# Patient Record
Sex: Female | Born: 1960
Health system: Southern US, Community
[De-identification: ages and names within clinical notes are randomized; demographics above are authoritative.]

## PROBLEM LIST (undated history)

## (undated) DIAGNOSIS — F419 Anxiety disorder, unspecified: Secondary | ICD-10-CM

## (undated) DIAGNOSIS — E785 Hyperlipidemia, unspecified: Secondary | ICD-10-CM

## (undated) DIAGNOSIS — Z8619 Personal history of other infectious and parasitic diseases: Secondary | ICD-10-CM

## (undated) DIAGNOSIS — I10 Essential (primary) hypertension: Secondary | ICD-10-CM

## (undated) HISTORY — DX: Personal history of other infectious and parasitic diseases: Z86.19

## (undated) HISTORY — DX: Essential (primary) hypertension: I10

## (undated) HISTORY — DX: Hyperlipidemia, unspecified: E78.5

## (undated) HISTORY — DX: Anxiety disorder, unspecified: F41.9

## (undated) HISTORY — PX: OTHER SURGICAL HISTORY: SHX169

---

## 2000-09-02 ENCOUNTER — Emergency Department (HOSPITAL_COMMUNITY): Admission: EM | Admit: 2000-09-02 | Discharge: 2000-09-02 | Payer: Self-pay | Admitting: *Deleted

## 2000-09-02 ENCOUNTER — Encounter: Payer: Self-pay | Admitting: Emergency Medicine

## 2001-02-16 ENCOUNTER — Encounter: Payer: Self-pay | Admitting: *Deleted

## 2001-02-16 ENCOUNTER — Ambulatory Visit (HOSPITAL_COMMUNITY): Admission: RE | Admit: 2001-02-16 | Discharge: 2001-02-16 | Payer: Self-pay | Admitting: *Deleted

## 2005-09-26 ENCOUNTER — Ambulatory Visit (HOSPITAL_COMMUNITY): Admission: RE | Admit: 2005-09-26 | Discharge: 2005-09-26 | Payer: Self-pay | Admitting: Obstetrics and Gynecology

## 2005-10-13 ENCOUNTER — Encounter: Admission: RE | Admit: 2005-10-13 | Discharge: 2005-10-13 | Payer: Self-pay | Admitting: Family Medicine

## 2006-09-30 ENCOUNTER — Ambulatory Visit (HOSPITAL_COMMUNITY): Admission: RE | Admit: 2006-09-30 | Discharge: 2006-09-30 | Payer: Self-pay | Admitting: Obstetrics and Gynecology

## 2007-10-01 ENCOUNTER — Ambulatory Visit (HOSPITAL_COMMUNITY): Admission: RE | Admit: 2007-10-01 | Discharge: 2007-10-01 | Payer: Self-pay | Admitting: Obstetrics and Gynecology

## 2008-10-03 ENCOUNTER — Ambulatory Visit (HOSPITAL_COMMUNITY): Admission: RE | Admit: 2008-10-03 | Discharge: 2008-10-03 | Payer: Self-pay | Admitting: Obstetrics and Gynecology

## 2010-05-05 HISTORY — PX: COLONOSCOPY: SHX174

## 2010-07-08 ENCOUNTER — Other Ambulatory Visit (HOSPITAL_COMMUNITY): Payer: Self-pay | Admitting: Internal Medicine

## 2010-07-08 DIAGNOSIS — Z1231 Encounter for screening mammogram for malignant neoplasm of breast: Secondary | ICD-10-CM

## 2010-07-12 ENCOUNTER — Ambulatory Visit (HOSPITAL_COMMUNITY)
Admission: RE | Admit: 2010-07-12 | Discharge: 2010-07-12 | Disposition: A | Discharge status: Home / Self Care | Payer: BC Managed Care – PPO | Source: Ambulatory Visit | Attending: Internal Medicine | Admitting: Internal Medicine

## 2010-07-12 DIAGNOSIS — Z1231 Encounter for screening mammogram for malignant neoplasm of breast: Secondary | ICD-10-CM | POA: Insufficient documentation

## 2010-07-16 ENCOUNTER — Other Ambulatory Visit: Payer: Self-pay | Admitting: Internal Medicine

## 2010-07-16 DIAGNOSIS — R928 Other abnormal and inconclusive findings on diagnostic imaging of breast: Secondary | ICD-10-CM

## 2010-07-19 ENCOUNTER — Ambulatory Visit
Admission: RE | Admit: 2010-07-19 | Discharge: 2010-07-19 | Disposition: A | Discharge status: Home / Self Care | Payer: BC Managed Care – PPO | Source: Ambulatory Visit | Attending: Internal Medicine | Admitting: Internal Medicine

## 2010-07-19 DIAGNOSIS — R928 Other abnormal and inconclusive findings on diagnostic imaging of breast: Secondary | ICD-10-CM

## 2011-02-19 ENCOUNTER — Encounter: Payer: Self-pay | Admitting: Gastroenterology

## 2011-07-09 ENCOUNTER — Other Ambulatory Visit (HOSPITAL_COMMUNITY): Payer: Self-pay | Admitting: Obstetrics and Gynecology

## 2011-07-09 DIAGNOSIS — Z1231 Encounter for screening mammogram for malignant neoplasm of breast: Secondary | ICD-10-CM

## 2011-10-31 ENCOUNTER — Ambulatory Visit (HOSPITAL_COMMUNITY)
Admission: RE | Admit: 2011-10-31 | Discharge: 2011-10-31 | Disposition: A | Discharge status: Home / Self Care | Payer: BC Managed Care – PPO | Source: Ambulatory Visit | Attending: Obstetrics and Gynecology | Admitting: Obstetrics and Gynecology

## 2011-10-31 DIAGNOSIS — Z1231 Encounter for screening mammogram for malignant neoplasm of breast: Secondary | ICD-10-CM | POA: Insufficient documentation

## 2012-06-14 ENCOUNTER — Ambulatory Visit
Admission: RE | Admit: 2012-06-14 | Discharge: 2012-06-14 | Disposition: A | Discharge status: Home / Self Care | Payer: 59 | Source: Ambulatory Visit | Attending: Internal Medicine | Admitting: Internal Medicine

## 2012-06-14 ENCOUNTER — Other Ambulatory Visit: Payer: Self-pay | Admitting: Internal Medicine

## 2012-06-14 DIAGNOSIS — M25552 Pain in left hip: Secondary | ICD-10-CM

## 2012-09-24 ENCOUNTER — Other Ambulatory Visit (HOSPITAL_COMMUNITY): Payer: Self-pay | Admitting: Obstetrics and Gynecology

## 2012-09-24 DIAGNOSIS — Z1231 Encounter for screening mammogram for malignant neoplasm of breast: Secondary | ICD-10-CM

## 2012-11-01 ENCOUNTER — Ambulatory Visit (HOSPITAL_COMMUNITY)
Admission: RE | Admit: 2012-11-01 | Discharge: 2012-11-01 | Disposition: A | Discharge status: Home / Self Care | Payer: 59 | Source: Ambulatory Visit | Attending: Obstetrics and Gynecology | Admitting: Obstetrics and Gynecology

## 2012-11-01 DIAGNOSIS — Z1231 Encounter for screening mammogram for malignant neoplasm of breast: Secondary | ICD-10-CM | POA: Insufficient documentation

## 2013-02-15 ENCOUNTER — Ambulatory Visit
Admission: RE | Admit: 2013-02-15 | Discharge: 2013-02-15 | Disposition: A | Discharge status: Home / Self Care | Payer: 59 | Source: Ambulatory Visit | Attending: Internal Medicine | Admitting: Internal Medicine

## 2013-02-15 ENCOUNTER — Other Ambulatory Visit: Payer: Self-pay | Admitting: Internal Medicine

## 2013-02-15 DIAGNOSIS — R0602 Shortness of breath: Secondary | ICD-10-CM

## 2013-11-17 ENCOUNTER — Other Ambulatory Visit (HOSPITAL_COMMUNITY): Payer: Self-pay | Admitting: Obstetrics and Gynecology

## 2013-11-17 DIAGNOSIS — Z1231 Encounter for screening mammogram for malignant neoplasm of breast: Secondary | ICD-10-CM

## 2014-01-23 ENCOUNTER — Ambulatory Visit (HOSPITAL_COMMUNITY): Admission: RE | Admit: 2014-01-23 | Payer: 59 | Source: Ambulatory Visit

## 2014-03-06 ENCOUNTER — Ambulatory Visit (HOSPITAL_COMMUNITY)
Admission: RE | Admit: 2014-03-06 | Discharge: 2014-03-06 | Disposition: A | Discharge status: Home / Self Care | Payer: 59 | Source: Ambulatory Visit | Attending: Obstetrics and Gynecology | Admitting: Obstetrics and Gynecology

## 2014-03-06 DIAGNOSIS — Z1231 Encounter for screening mammogram for malignant neoplasm of breast: Secondary | ICD-10-CM

## 2017-05-07 ENCOUNTER — Telehealth: Payer: Self-pay | Admitting: Podiatry

## 2017-05-07 ENCOUNTER — Encounter: Payer: Self-pay | Admitting: Podiatry

## 2017-05-07 ENCOUNTER — Ambulatory Visit (INDEPENDENT_AMBULATORY_CARE_PROVIDER_SITE_OTHER): Payer: Self-pay | Admitting: Podiatry

## 2017-05-07 DIAGNOSIS — M779 Enthesopathy, unspecified: Secondary | ICD-10-CM

## 2017-05-07 NOTE — Telephone Encounter (Signed)
I was seen today by Dr. Paulla Dolly. I was just calling to make sure he sends my notes to my PCP Dr. Joylene Draft at [facility]. You don't have to call me. I just didn't know if it was automatic or if I had to request it. Thank you.

## 2017-05-07 NOTE — Progress Notes (Signed)
Subjective:   Patient ID: Natalie Li, female   DOB: 57 y.o.   MRN: [id number]   HPI Patient presents with pain in the dorsum plantar of both feet left over right and states it is mostly related to activity and she did step on some glass earlier was wondering if that could be part of this.  Patient does not smoke and likes to be active   Review of Systems  All other systems reviewed and are negative.       Objective:  Physical Exam  Constitutional: She appears well-developed and well-nourished.  Cardiovascular: Intact distal pulses.  Pulmonary/Chest: Effort normal.  Musculoskeletal: Normal range of motion.  Neurological: She is alert.  Skin: Skin is warm.  Nursing note and vitals reviewed.   Neurovascular status intact muscle strength adequate range of motion within normal limits with patient noted to have discomfort in the second and third MPJs left over right of a moderate nature and what appears to be a positive Mulder sign with mass formation palpated but not painful with pressure.  Patient's pain is not significant now as she has not been active on her foot over the last week     Assessment:  Inflammatory capsulitis most likely of the second and third MPJs left over right with possibility for neuroma symptoms     Plan:  H&P and I reviewed the x-rays with her and today I want hard I applied padding to take pressure off the joint reviewed anti-inflammatory usage and she will take home Advil and we are can get her to a sporting goods store for new shoes with a more rigid sole.  This may require injection treatment if symptoms were to persist

## 2017-05-20 ENCOUNTER — Telehealth: Payer: Self-pay | Admitting: Podiatry

## 2017-05-20 NOTE — Telephone Encounter (Signed)
I saw Dr. Paulla Dolly on Thursday 03 January. He really helped me and gave me some pads for my feet. I apparently had some kind of metatarsal attack going on. I'm very much improved. I've been trying to find more of these pads that are a specific shape but I have not been able to find them online and I also went to a medical supply store and they did not have any either. I was wondering if you guys could help me because I would love to buy a bunch more since they help me so much. You can call me back at [phone]. Thank you.

## 2017-05-20 NOTE — Telephone Encounter (Signed)
Left message informing pt that she could pick up the pads at our office.

## 2017-09-01 ENCOUNTER — Other Ambulatory Visit: Payer: Self-pay | Admitting: Obstetrics and Gynecology

## 2017-09-01 DIAGNOSIS — Z1231 Encounter for screening mammogram for malignant neoplasm of breast: Secondary | ICD-10-CM

## 2017-10-14 ENCOUNTER — Ambulatory Visit: Payer: 59

## 2018-05-24 DIAGNOSIS — H2513 Age-related nuclear cataract, bilateral: Secondary | ICD-10-CM | POA: Diagnosis not present

## 2018-05-24 DIAGNOSIS — H43812 Vitreous degeneration, left eye: Secondary | ICD-10-CM | POA: Diagnosis not present

## 2018-07-05 DIAGNOSIS — H43812 Vitreous degeneration, left eye: Secondary | ICD-10-CM | POA: Diagnosis not present

## 2018-07-05 DIAGNOSIS — H25013 Cortical age-related cataract, bilateral: Secondary | ICD-10-CM | POA: Diagnosis not present

## 2018-07-05 DIAGNOSIS — H43392 Other vitreous opacities, left eye: Secondary | ICD-10-CM | POA: Diagnosis not present

## 2018-07-05 DIAGNOSIS — H5319 Other subjective visual disturbances: Secondary | ICD-10-CM | POA: Diagnosis not present

## 2018-07-05 DIAGNOSIS — H524 Presbyopia: Secondary | ICD-10-CM | POA: Diagnosis not present

## 2018-12-14 DIAGNOSIS — F4323 Adjustment disorder with mixed anxiety and depressed mood: Secondary | ICD-10-CM | POA: Diagnosis not present

## 2018-12-23 DIAGNOSIS — Z0000 Encounter for general adult medical examination without abnormal findings: Secondary | ICD-10-CM | POA: Diagnosis not present

## 2018-12-23 DIAGNOSIS — Z23 Encounter for immunization: Secondary | ICD-10-CM | POA: Diagnosis not present

## 2018-12-23 DIAGNOSIS — Z13820 Encounter for screening for osteoporosis: Secondary | ICD-10-CM | POA: Diagnosis not present

## 2018-12-23 DIAGNOSIS — E7849 Other hyperlipidemia: Secondary | ICD-10-CM | POA: Diagnosis not present

## 2018-12-28 DIAGNOSIS — F4323 Adjustment disorder with mixed anxiety and depressed mood: Secondary | ICD-10-CM | POA: Diagnosis not present

## 2018-12-29 DIAGNOSIS — R82998 Other abnormal findings in urine: Secondary | ICD-10-CM | POA: Diagnosis not present

## 2018-12-30 DIAGNOSIS — Z780 Asymptomatic menopausal state: Secondary | ICD-10-CM | POA: Diagnosis not present

## 2018-12-30 DIAGNOSIS — H579 Unspecified disorder of eye and adnexa: Secondary | ICD-10-CM | POA: Diagnosis not present

## 2018-12-30 DIAGNOSIS — R202 Paresthesia of skin: Secondary | ICD-10-CM | POA: Diagnosis not present

## 2018-12-30 DIAGNOSIS — F329 Major depressive disorder, single episode, unspecified: Secondary | ICD-10-CM | POA: Diagnosis not present

## 2018-12-30 DIAGNOSIS — Z7689 Persons encountering health services in other specified circumstances: Secondary | ICD-10-CM | POA: Diagnosis not present

## 2018-12-30 DIAGNOSIS — Z1331 Encounter for screening for depression: Secondary | ICD-10-CM | POA: Diagnosis not present

## 2018-12-30 DIAGNOSIS — Z0000 Encounter for general adult medical examination without abnormal findings: Secondary | ICD-10-CM | POA: Diagnosis not present

## 2019-01-03 DIAGNOSIS — Z1231 Encounter for screening mammogram for malignant neoplasm of breast: Secondary | ICD-10-CM | POA: Diagnosis not present

## 2019-01-03 DIAGNOSIS — Z6822 Body mass index (BMI) 22.0-22.9, adult: Secondary | ICD-10-CM | POA: Diagnosis not present

## 2019-01-03 DIAGNOSIS — Z1151 Encounter for screening for human papillomavirus (HPV): Secondary | ICD-10-CM | POA: Diagnosis not present

## 2019-01-03 DIAGNOSIS — Z01419 Encounter for gynecological examination (general) (routine) without abnormal findings: Secondary | ICD-10-CM | POA: Diagnosis not present

## 2019-01-04 DIAGNOSIS — F4323 Adjustment disorder with mixed anxiety and depressed mood: Secondary | ICD-10-CM | POA: Diagnosis not present

## 2019-01-11 DIAGNOSIS — F4323 Adjustment disorder with mixed anxiety and depressed mood: Secondary | ICD-10-CM | POA: Diagnosis not present

## 2019-01-18 DIAGNOSIS — F4323 Adjustment disorder with mixed anxiety and depressed mood: Secondary | ICD-10-CM | POA: Diagnosis not present

## 2019-01-25 DIAGNOSIS — F4323 Adjustment disorder with mixed anxiety and depressed mood: Secondary | ICD-10-CM | POA: Diagnosis not present

## 2019-02-15 DIAGNOSIS — F4323 Adjustment disorder with mixed anxiety and depressed mood: Secondary | ICD-10-CM | POA: Diagnosis not present

## 2019-02-23 DIAGNOSIS — Z7189 Other specified counseling: Secondary | ICD-10-CM | POA: Diagnosis not present

## 2019-02-23 DIAGNOSIS — Z03818 Encounter for observation for suspected exposure to other biological agents ruled out: Secondary | ICD-10-CM | POA: Diagnosis not present

## 2019-02-23 DIAGNOSIS — Z20828 Contact with and (suspected) exposure to other viral communicable diseases: Secondary | ICD-10-CM | POA: Diagnosis not present

## 2019-05-06 HISTORY — PX: COLONOSCOPY: SHX174

## 2019-06-27 ENCOUNTER — Encounter: Payer: Self-pay | Admitting: Gastroenterology

## 2019-06-27 DIAGNOSIS — F329 Major depressive disorder, single episode, unspecified: Secondary | ICD-10-CM | POA: Diagnosis not present

## 2019-06-27 DIAGNOSIS — E785 Hyperlipidemia, unspecified: Secondary | ICD-10-CM | POA: Diagnosis not present

## 2019-06-27 DIAGNOSIS — F900 Attention-deficit hyperactivity disorder, predominantly inattentive type: Secondary | ICD-10-CM | POA: Diagnosis not present

## 2019-06-27 DIAGNOSIS — I10 Essential (primary) hypertension: Secondary | ICD-10-CM | POA: Diagnosis not present

## 2019-07-25 ENCOUNTER — Ambulatory Visit: Payer: 59 | Admitting: Gastroenterology

## 2019-07-27 DIAGNOSIS — F329 Major depressive disorder, single episode, unspecified: Secondary | ICD-10-CM | POA: Diagnosis not present

## 2019-07-27 DIAGNOSIS — R030 Elevated blood-pressure reading, without diagnosis of hypertension: Secondary | ICD-10-CM | POA: Diagnosis not present

## 2019-07-28 DIAGNOSIS — R030 Elevated blood-pressure reading, without diagnosis of hypertension: Secondary | ICD-10-CM | POA: Diagnosis not present

## 2019-08-08 ENCOUNTER — Other Ambulatory Visit: Payer: Self-pay

## 2019-08-08 ENCOUNTER — Encounter: Payer: Self-pay | Admitting: Neurology

## 2019-08-08 ENCOUNTER — Ambulatory Visit (INDEPENDENT_AMBULATORY_CARE_PROVIDER_SITE_OTHER): Payer: 59 | Admitting: Neurology

## 2019-08-08 VITALS — BP 126/80 | HR 75 | Ht 69.75 in | Wt 150.0 lb

## 2019-08-08 DIAGNOSIS — R419 Unspecified symptoms and signs involving cognitive functions and awareness: Secondary | ICD-10-CM | POA: Diagnosis not present

## 2019-08-08 DIAGNOSIS — R413 Other amnesia: Secondary | ICD-10-CM | POA: Diagnosis not present

## 2019-08-08 NOTE — Patient Instructions (Signed)
You have complaints of memory loss: memory loss or changes in cognitive function can have many reasons and does not always mean you have dementia. Conditions that can contribute to subjective or objective memory loss include: depression, stress, poor sleep from insomnia or sleep apnea, dehydration, fluctuation in blood sugar values, thyroid or electrolyte dysfunction and certain vitamin deficiencies. Dementia can be caused by stroke, brain atherosclerosis or brain vascular disease due to vascular risk factors (smoking, high blood pressure, high cholesterol, obesity and uncontrolled diabetes), certain degenerative brain disorders (including Parkinson's disease and Multiple sclerosis) and by Alzheimer's disease or other, more rare and sometimes hereditary causes. We will do some additional testing and you had recent blood work through your primary care physician.  We will do a brain scan, called MRI and call you with the test results. We will have to schedule you for this on a separate date. This test requires authorization from your insurance, and we will take care of the insurance process. We will also request a formal cognitive test called neuropsychological evaluation which is done by a licensed neuropsychologist. We will make a referral in that regard. We will call you with brain scan test results and monitor your symptoms.

## 2019-08-08 NOTE — Progress Notes (Signed)
Subjective:    Patient ID: Natalie Li is a 59 y.o. female.  HPI     Star Age, MD, PhD [facility] [address], [address] [place], [place] [address]  Dear Dr. Joylene Draft,   I saw your patient, Natalie Li, upon your kind request, in my Neurologic clinic today for initial consultation of her memory loss.  The patient is unaccompanied today.  As you know, Natalie Li is a 59 year old right-handed woman with an underlying medical history of ADD, depression, anxiety, neck pain, and plantar fasciitis, who reports problems with her cognitive function for the past 3 months.  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  She reports having difficulty spelling.  She is wondering if she has had a form of mild dyslexia since childhood.  She sometimes repeats herself.  She has word finding difficulties.  She had one episode of difficulty driving at night, she did not know where she was for a brief period of time, this did not happen again.  She had an increase in her Xanax in the recent past from 0.25 mg to 0.5 mg and she takes up to 4 pills at night for sleep.  Typically she takes 2 at night but may take 2 more during the night if she cannot sleep.  She reports increase in stress in the past 3 months.  She lives with her husband and her mother.  She reports that her maternal grandmother had Alzheimer's disease but her mother does not.  Mom has a history of stroke and TIA.  Patient reports history of weight loss.  She reports that she had extensive blood work through your office recently.  We will request blood test results.  She has had intermittent blurry vision and also intermittent numbness and tingling in her hands and feet.  She has a history of plantar fasciitis.  The numbness does not bother her very much.  She denies snoring.  I reviewed the office note from 06/27/2019. 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.  She had a brain MRI with and without contrast in June 2017 which was reported as negative cranial MRI.  She had an episode of right-sided facial tingling and right arm numbness which eventually resolved. She is a non-smoker but has a history of intermittent increase in her alcohol use.  She reports that she reduced her alcohol intake and now drinks a drink in the late afternoon, around 5 PM.  She drinks about 3 cups of water with meals, she drinks 2 cups of coffee in the morning and 2 to 4 cups of milk per day.  Her Past Medical History Is Significant For: Past Medical History:  Diagnosis Date  . Anxiety   . Hypertension     Her Past Surgical History Is Significant For: No past surgical history on file.  Her Family History Is Significant For: Family History  Problem Relation Age of Onset  . Depression Mother     Her Social History Is Significant For: Social History   Socioeconomic History  . Marital status: Married    Spouse name: Not on file  . Number of children: Not on file  . Years of education: Not on file  . Highest education level: Not on file  Occupational History  . Not on file  Tobacco Use  . Smoking status: Never Smoker  . Smokeless  tobacco: Never Used  Substance and Sexual Activity  . Alcohol use: Yes  . Drug use: Not on file  . Sexual activity: Not on file  Other Topics Concern  . Not on file  Social History Narrative  . Not on file   Social Determinants of Health   Financial Resource Strain:   . Difficulty of Paying Living Expenses:   Food Insecurity:   . Worried About Charity fundraiser in the Last Year:   . Arboriculturist in the Last Year:   Transportation Needs:   . Film/video editor (Medical):   Marland Kitchen Lack of Transportation (Non-Medical):   Physical Activity:   . Days of Exercise per Week:   . Minutes of Exercise per Session:   Stress:   . Feeling of Stress :   Social Connections:   .  Frequency of Communication with Friends and Family:   . Frequency of Social Gatherings with Friends and Family:   . Attends Religious Services:   . Active Member of Clubs or Organizations:   . Attends Archivist Meetings:   Marland Kitchen Marital Status:     Her Allergies Are:  Allergies  Allergen Reactions  . Prednisone Rash    anxious  :   Her Current Medications Are:  Outpatient Encounter Medications as of 08/08/2019  Medication Sig  . ALPRAZolam (XANAX) 0.5 MG tablet Take 1-2 mg by mouth at bedtime as needed.   . Amphetamine-Dextroamphetamine (ADDERALL PO) Take 2.5 mg by mouth daily as needed. Takes only a quarter of a 10 mg tablet a couple times per year.  Marland Kitchen buPROPion (WELLBUTRIN XL) 300 MG 24 hr tablet Take 300 mg by mouth daily.  . Cholecalciferol (VITAMIN D3) 50 MCG (2000 UT) TABS Take by mouth.  . valsartan (DIOVAN) 40 MG tablet Take 40 mg by mouth daily.  . vitamin B-12 (CYANOCOBALAMIN) 1000 MCG tablet Take 1,000 mcg by mouth daily.  . [DISCONTINUED] ALPRAZolam (XANAX) 0.25 MG tablet Take 0.25 mg by mouth at bedtime as needed for anxiety.  . [DISCONTINUED] amphetamine-dextroamphetamine (ADDERALL) 10 MG tablet Take 2.5 mg by mouth daily with breakfast. Takes only a couple times a year.  . [DISCONTINUED] amphetamine-dextroamphetamine (ADDERALL) 10 MG tablet Take 2.5 mg by mouth daily as needed. Takes a couple times per year.  . [DISCONTINUED] buPROPion (WELLBUTRIN) 100 MG tablet Take 100 mg by mouth 2 (two) times daily.   No facility-administered encounter medications on file as of 08/08/2019.  :   Review of Systems:  Out of a complete 14 point review of systems, all are reviewed and negative with the exception of these symptoms as listed below:  Review of Systems  Neurological:       Pt presents today to discuss a variety of cognitive issues.    Objective:  Neurological Exam  Physical Exam Physical Examination:   Vitals:   08/08/19 0958  BP: 126/80  Pulse: 75     General Examination: The patient is a very pleasant 59 y.o. female in no acute distress. She appears well-developed and well-nourished and well groomed.   HEENT: Normocephalic, atraumatic, pupils are equal, round and reactive to light and accommodation. Funduscopic exam is normal with sharp disc margins noted. Extraocular tracking is good without limitation to gaze excursion or nystagmus noted. Normal smooth pursuit is noted. Hearing is grossly intact. Corrective eye glasses in place. Face is symmetric with normal facial animation and normal facial sensation. Speech is clear with no dysarthria noted. There is no  hypophonia. There is no lip, neck/head, jaw or voice tremor. Neck is supple with full range of passive and active motion. There are no carotid bruits on auscultation. Oropharynx exam reveals: moderate mouth dryness, adequate dental hygiene. Tongue protrudes centrally and palate elevates symmetrically.  Chest: Clear to auscultation without wheezing, rhonchi or crackles noted.  Heart: S1+S2+0, regular and normal without murmurs, rubs or gallops noted.   Abdomen: Soft, non-tender and non-distended with normal bowel sounds appreciated on auscultation.  Extremities: There is no pitting edema in the distal lower extremities bilaterally.  Skin: Warm and dry without trophic changes noted.  Musculoskeletal: exam reveals no obvious joint deformities, tenderness or joint swelling or erythema.   Neurologically:  Mental status: The patient is awake, alert and oriented in all 4 spheres. Her immediate and remote memory, attention, language skills and fund of knowledge are appropriate. There is no evidence of aphasia, agnosia, apraxia or anomia. Speech is clear with normal prosody and enunciation. Thought process is linear. Mood is normal and affect is normal.   On 08/08/2019: MMSE: 28/30 (exact date and serial seven, last point), CDT: 4/4, AFT: 20/min.  Cranial nerves II - XII are as described  above under HEENT exam. In addition: shoulder shrug is normal with equal shoulder height noted. Motor exam: Normal bulk, strength and tone is noted. There is no drift, tremor or rebound. Romberg is negative. Reflexes are 2+ throughout. Babinski: Toes are flexor bilaterally. Fine motor skills and coordination: intact with normal finger taps, normal hand movements, normal rapid alternating patting, normal foot taps and normal foot agility.  Cerebellar testing: No dysmetria or intention tremor on finger to nose testing. Heel to shin is unremarkable bilaterally. There is no truncal or gait ataxia.  Sensory exam: intact to light touch, vibration, temperature sense in the upper and lower extremities.  Gait, station and balance: She stands easily. No veering to one side is noted. No leaning to one side is noted. Posture is age-appropriate and stance is narrow based. Gait shows normal stride length and normal pace. No problems turning are noted.   Assessment and Plan:   In summary, Natalie Li is a very pleasant 59 y.o.-year old female with an underlying medical history of ADD, depression, anxiety, neck pain, and plantar fasciitis, who presents for evaluation of her cognitive complaints.  Her examination is largely benign, cognitive test results also fairly benign, nevertheless, given her concerns, we can certainly take a little deeper.  She is advised to proceed with a brain MRI without contrast as well as a formal evaluation through neuropsychology for in-depth cognitive evaluation.  She is agreeable to this approach.  She had blood work through your office and we will request blood test results from your office.  She is advised to follow-up with your office regarding her significant weight loss.  She indicates a over 30 pound weight loss within a year.  She is also advised to follow-up with your office regarding stress management.  She is cautioned regarding the use of Xanax as it can cause daytime sleepiness  and sluggishness and thinking even if only taken at night.  In addition, she is cautioned regarding the use of alcohol in close proximity to taking a benzodiazepine medication.  She is encouraged to stay well-hydrated with water.  We will keep her posted as to her brain MRI results and cognitive evaluation recommendations and follow-up if needed.  I answered all her questions today and she was in agreement.  Thank you very much  for allowing me to participate in the care of this nice patient. If I can be of any further assistance to you please do not hesitate to call me at [phone].  Sincerely,   Star Age, MD, PhD

## 2019-08-10 ENCOUNTER — Telehealth: Payer: Self-pay | Admitting: Neurology

## 2019-08-10 ENCOUNTER — Encounter: Payer: Self-pay | Admitting: Counselor

## 2019-08-10 NOTE — Telephone Encounter (Signed)
spoke to the patient she is going to have it at another location but will get back to me later to tell me where  [facility] auth: [place] via [employer] Auth: [id number] (exp. 08/10/19 to 02/05/20)

## 2019-08-10 NOTE — Telephone Encounter (Signed)
I reviewed blood test results from Dr. Silvestre Mesi office, she had blood test on 12/23/2018.  Vitamin B12 was 381, urinalysis negative, she had a CMP, CBC, lipids and TSH on 12/23/2018 with benign results, creatinine was 0.7, BUN 13.  TSH was 1.55, LDL 108, triglycerides 58, total cholesterol 175.  We did not receive any more recent blood test results.

## 2019-08-11 NOTE — Telephone Encounter (Signed)
Noted  

## 2019-08-22 ENCOUNTER — Encounter: Payer: Self-pay | Admitting: Gastroenterology

## 2019-08-22 ENCOUNTER — Ambulatory Visit (INDEPENDENT_AMBULATORY_CARE_PROVIDER_SITE_OTHER): Payer: BC Managed Care – PPO | Admitting: Gastroenterology

## 2019-08-22 VITALS — BP 120/82 | HR 99 | Temp 98.5°F | Ht 69.75 in | Wt 150.5 lb

## 2019-08-22 DIAGNOSIS — Z1211 Encounter for screening for malignant neoplasm of colon: Secondary | ICD-10-CM

## 2019-08-22 DIAGNOSIS — R634 Abnormal weight loss: Secondary | ICD-10-CM | POA: Diagnosis not present

## 2019-08-22 MED ORDER — PLENVU 140 G PO SOLR: 1.0000 | Freq: Once | ORAL | 0 refills | Status: AC

## 2019-08-22 NOTE — Progress Notes (Signed)
HPI :  59 y/o female with a history of anxiety, HTN, HLD referred by Dr. Crist Infante for weight loss and colon cancer screening.   She reports having a had a prior colonoscopy at age 4 and states it was normal, done in [place]. No records on file today, she denies any history of having polyps removed. No family history of colon cancer.    She was reported to have had about a 30 lb unintentional weight loss over the past 2 years.  She states this kind of crept up on her, she did not realize how much weight she had lost until she bought a scale in recent months and noticed a dramatic change.  She states over the past 2 years it has been in incredibly stressful time in her life.  She is taking full-time care of her mother who has Alzheimer's.  She is also had 3 friends who have passed away, 1 of which was quite close to her and she had a very difficult time with grieving.  She states she battled depression during this time and started drinking on a daily basis to help cope with her grief.  She thinks this perhaps was related to a lot of her weight loss that she is otherwise felt quite well.  She has stopped drinking a few months ago and her weight has dropped perhaps just a few pounds since doing that.  She eats well, sounds like she has at least 3 meals a day and states she eats dessert on top of that every day.  She denies any abdominal pains.  No nausea or vomiting.  No postprandial pains.  No reflux.  No dysphagia.  She has an occasional loose stool but otherwise no problems with her bowels.  No blood in her stools.  She has been vaccinated to Covid.  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  She denies any family history of celiac disease or IBD otherwise.  Generally she states she is been feeling pretty well otherwise, denies complaints today.  She had lab work in October which was entirely normal.  She had hoped to have a full evaluation for her weight loss however states her  [employer] which she is under has changed recently and she cannot afford any significant testing that is not completely covered by her insurance at this time.  She is hoping to proceed with a colonoscopy for her colon cancer screening.   Labs 12/23/18: Normal renal function. AST 16 ALT 13 AP 72 T bil 0.4 WBC 5.1 Hgb 13.4 HCT 40.8 PLT 329 TSH 1.55    Past Medical History:  Diagnosis Date  . Anxiety   . Hx of type A viral hepatitis    in [place] at age 32  . Hyperlipidemia   . Hypertension      Past Surgical History:  Procedure Laterality Date  . arm Right    as a child   . COLONOSCOPY     age 82. Dr Karlton Lemon    Family History  Problem Relation Age of Onset  . Depression Mother        mild bipolar  . Heart attack Father 15   Social History   Tobacco Use  . Smoking status: Never Smoker  . Smokeless tobacco: Never Used  Substance Use Topics  . Alcohol use: Yes    Comment: ~2 drinks per day  . Drug use: Not on file   Current Outpatient Medications  Medication  Sig Dispense Refill  . ALPRAZolam (XANAX) 0.5 MG tablet Take 1-2 mg by mouth at bedtime as needed.     Marland Kitchen buPROPion (WELLBUTRIN XL) 300 MG 24 hr tablet Take 300 mg by mouth daily.    . Cholecalciferol (VITAMIN D3) 50 MCG (2000 UT) TABS Take 1 tablet by mouth daily.     . valsartan (DIOVAN) 40 MG tablet Take 20 mg by mouth daily.     . vitamin B-12 (CYANOCOBALAMIN) 1000 MCG tablet Take 1,000 mcg by mouth daily.    . Amphetamine-Dextroamphetamine (ADDERALL PO) Take 2.5 mg by mouth daily as needed. Takes only a quarter of a 10 mg tablet a couple times per year.     No current facility-administered medications for this visit.   Allergies  Allergen Reactions  . Prednisone Rash    anxious     Review of Systems: All systems reviewed and negative except where noted in HPI.    See HPI  Physical Exam: Temp 98.5 F (36.9 C)   Ht 5' 9.75" (1.772 m)   Wt 150 lb 8 oz (68.3 kg)   BMI 21.75 kg/m    Constitutional: Pleasant, female in no acute distress. HEENT: Normocephalic and atraumatic. Conjunctivae are normal. No scleral icterus. Neck supple.  Cardiovascular: Normal rate, regular rhythm.  Pulmonary/chest: Effort normal and breath sounds normal. No wheezing, rales or rhonchi. Abdominal: Soft, nondistended, nontender.  There are no masses palpable.  Extremities: no edema Lymphadenopathy: No cervical adenopathy noted. Neurological: Alert and oriented to person place and time. Skin: Skin is warm and dry. No rashes noted. Psychiatric: Normal mood and affect. Behavior is normal.   ASSESSMENT AND PLAN: 59 year old female here for new patient assessment of the following:  Weight loss - she has lost an impressive 30 pounds over the past 2 years unintentionally.  During this time she has been grieving the loss of a dear friend and providing full-time care for her mother with dementia.  She subjectively feels that her weight loss is related to these issues and the fact that she was drinking routinely for several months, and that's very possible.  She stopped routine alcohol use which is good, however she has not regained the lost weight.  She otherwise has absolutely no symptoms.  Her labs done to evaluate this last year with Dr. Joylene Draft were normal including TSH.  I discussed options with her.  She has no lower tract symptoms but will proceed with colonoscopy as below for screening purposes.  We discussed the role of potentially performing an upper endoscopy in light of her weight loss, or consideration of CT scan chest abdomen pelvis with her weight loss.  Given the amount of weight that she is lost I think it is reasonable to pursue further evaluation however she states due to insurance change she really cannot afford any additional testing right now for this issue and declines additional evaluation at this time.  She is going to keep a close eye on her weight, she thinks she is in a better space  in regards to stress in her life and getting over the grief of the loss of her friend, and has stopped drinking, hopefully things will improve.  She will contact me if and when she is ready to pursue additional testing.  If she has significant additional weight loss CT scan chest abdomen pelvis would be the recommended next step for this.  She agreed  Colon cancer screening - average risk for colon cancer with a negative exam  10 years ago.  Discussed options for screening modalities and she wanted to proceed with optical colonoscopy.  Discussed risk and benefits of this and she was referred to the schedule, wanted to proceed.  I think this test is unlikely to provide a cause for weight loss but will await the results.  Merrifield Cellar, MD [facility]  I spent 45 minutes of time, including in depth chart review, face-to-face time with the patient, coordinating care, and documentation of this encounter.    CC: Crist Infante, MD

## 2019-08-22 NOTE — Patient Instructions (Addendum)
If you are age 61 or older, your body mass index should be between 23-30. Your Body mass index is 21.75 kg/m. If this is out of the aforementioned range listed, please consider follow up with your Primary Care Provider.  If you are age 89 or younger, your body mass index should be between 19-25. Your Body mass index is 21.75 kg/m. If this is out of the aformentioned range listed, please consider follow up with your Primary Care Provider.   You have been scheduled for a colonoscopy. Please follow written instructions given to you at your visit today.  Please pick up your prep supplies at the pharmacy within the next 1-3 days. If you use inhalers (even only as needed), please bring them with you on the day of your procedure. Your physician has requested that you go to www.startemmi.com and enter the access code given to you at your visit today. This web site gives a general overview about your procedure. However, you should still follow specific instructions given to you by our office regarding your preparation for the procedure.  We are giving you a free sample of Plenvu today to use for your colonoscopy prep.  Thank you for entrusting me with your care and for choosing [facility], Dr. Nunez Cellar

## 2019-09-19 ENCOUNTER — Encounter: Payer: BC Managed Care – PPO | Admitting: Gastroenterology

## 2019-10-10 ENCOUNTER — Other Ambulatory Visit: Payer: Self-pay | Admitting: Gastroenterology

## 2019-10-10 ENCOUNTER — Ambulatory Visit (INDEPENDENT_AMBULATORY_CARE_PROVIDER_SITE_OTHER): Payer: BC Managed Care – PPO

## 2019-10-10 DIAGNOSIS — Z1159 Encounter for screening for other viral diseases: Secondary | ICD-10-CM

## 2019-10-10 LAB — SARS CORONAVIRUS 2 (TAT 6-24 HRS): SARS Coronavirus 2: NEGATIVE

## 2019-10-11 ENCOUNTER — Ambulatory Visit: Payer: BC Managed Care – PPO

## 2019-10-11 ENCOUNTER — Encounter: Payer: Self-pay | Admitting: Counselor

## 2019-10-11 ENCOUNTER — Ambulatory Visit (INDEPENDENT_AMBULATORY_CARE_PROVIDER_SITE_OTHER): Payer: BC Managed Care – PPO | Admitting: Counselor

## 2019-10-11 ENCOUNTER — Other Ambulatory Visit: Payer: Self-pay

## 2019-10-11 ENCOUNTER — Encounter: Payer: BC Managed Care – PPO | Admitting: Gastroenterology

## 2019-10-11 DIAGNOSIS — Z730 Burn-out: Secondary | ICD-10-CM

## 2019-10-11 DIAGNOSIS — F09 Unspecified mental disorder due to known physiological condition: Secondary | ICD-10-CM | POA: Diagnosis not present

## 2019-10-11 DIAGNOSIS — R4189 Other symptoms and signs involving cognitive functions and awareness: Secondary | ICD-10-CM

## 2019-10-11 NOTE — Progress Notes (Signed)
° °  Psychometrist Note   Cognitive testing was administered to Natalie Li by Lamar Benes, B.S. (Technician) under the supervision of Alphonzo Severance, Psy.D., ABN. [name] was able to tolerate all test procedures. Dr. Nicole Kindred met with the patient as needed to manage any emotional reactions to the testing procedures (if applicable). Rest breaks were offered.    The battery of tests administered was selected by Dr. Nicole Kindred with consideration to the patient's current level of functioning, the nature of her symptoms, emotional and behavioral responses during the interview, level of literacy, observed level of motivation/effort, and the nature of the referral question. This battery was communicated to the psychometrist. Communication between Dr. Nicole Kindred and the psychometrist was ongoing throughout the evaluation and Dr. Nicole Kindred was immediately accessible at all times. Dr. Nicole Kindred provided supervision to the technician on the date of this service, to the extent necessary to assure the quality of all services provided.    [name] will return in approximately one week for an interactive feedback session with Dr. Nicole Kindred, at which time female test performance, clinical impressions, and treatment recommendations will be reviewed in detail. The patient understands she can contact our office should she require our assistance before this time.   A total of 150 minutes of billable time were spent with Natalie Li by the technician, including test administration and scoring time. Billing for these services is reflected in Dr. Les Pou note.   This note reflects time spent with the psychometrician and does not include test scores, clinical history, or any interpretations made by Dr. Nicole Kindred. The full report will follow in a separate note.

## 2019-10-11 NOTE — Progress Notes (Signed)
[facility]  Patient Name: Natalie Li MRN: [id number] Date of Birth: June 24, 1960 Age: 59 y.o. Education: 20+ years  Referral Circumstances and Background Information  Natalie Li is a 59 y.o., left-hand dominant, married woman with a history of caregiver burnout that she has been working on as she has been caring for her mother over the past 5 years. Her mother had a hemorrhagic stroke at that time and had a history of ischemic strokes and other medical illnesses before that. It sounds like that has been extremely stressful. She has been having subjective cognitive changes and was referred by Dr. Rexene Alberts for further evaluation.   On interview, Dr. Inda Merlin reported that she is under a tremendous amount of stress and is "75% sure" that her issues are related to that, but nonetheless she wanted to have them evaluated to make sure she is not developing early onset dementia. She has been having a hard time getting her work done and does not feel like she is working up to her usual standard. She admits that some of this is because she doesn't have time to do her work because of care giving responsibilities, her mother requires something or other about every 15 minutes. She tries to do her work from [employer] to 12pm when she is sleeping, but often is so exhausted she cannot get it done. 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 She went down to two classes in 2018, and then in 2019 she decreased to one class. She is able to keep up with it but feels like she isn't able to do her work well. She is concerned that she may be developing early onset dementia. She stated that her stress level is high, and she seems to have a medical "meltdown" every semester. For instance, she got plantar fascitis and was couch bound for 3 weeks a few semesters ago and then threw her back out another  semester. In terms of cognitive problems, she stated that her concerning issues include word finding problems, she also takes notes on her phone and wrote "pound" of milk instead of gallon, which seemed like a very large error to her. She also at times confuses words, for instance she was having a hard time differentiating between the words "difference" and "different." She feels in general like her writing is not good and doesn't always make sense. She stated that her husband is not particularly concerned although also described him as quite hands off. It doesn't sound like she is having much in the way of memory loss. She does perceive herself as having some difficulties with time relationships and "carbon dating" when things happened in relation to each other. With respect to mood, the patient acknowledges that she is under a tremendous amount of stress and at times feels like "melting down." She denied feeling sad but stated that she feels "completely overwhelmed" and "trapped." She guessed that she is getting 2 hours of sleep, and then is up again, and then 2 hours of sleep, for a total of about 6 hours. She stated she is "constantly on alert" related to her mother, who does not sleep well and has a reversed sleep wake cycle relative to the patient's. She was taking a fair amount of xanax but dialed it back (she stated she really doesn't like to depend on that). Her energy is poor and  she feels extremely fatigued. She feels like she gets tired easily. She has lost 40lbs over the past 2 years. She has also lost 5 friends to cancer over the past 5 years as she has been care taking for her mother. She lost her closest friend in July.   With respect to functioning, the patient reported that she has been having some minor issues keeping track of finances for the past two years, although she is still managing her mother's bills and her own finances and is doing adequately. She is the primary caretaker for her  mother who requires an extreme amount of aid, it sounds like, she is essentially 24/7 involved in helping her. She manages all her medical appointments, medications, and the like. At work, she feels like the main issue is with writing. All her communication is through writing and she feels like she isn't writing well and has a hard time formulating her thoughts verbally. She feels like people "could" complain about her performance, although they have not complained. Her old mentor is her boss now and it sounds like he has been very supportive and also will not let her quit. 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 She admits that she has high standards for herself and can be perfectionistic. She is doing fine with driving, she said she got lost once, for 30 seconds at night but it has not recurred. She stated that she is "terrible" at remembering her own medications. She has used alarms but it sounds like she needs to institute a pill organizer and hasn't gotten to that yet.   Past Medical History and Review of Relevant Studies  There are no problems to display for this patient.  Review of Neuroimaging and Relevant Medical History: :  The patient has no recent neuroimaging on file. She had an MRI of the brain in 2007, which was normal and shows only mild involutional changes on my review. There is no significant leukoaraiosis. She has another MRI pending.   Current Outpatient Medications  Medication Sig Dispense Refill  . ALPRAZolam (XANAX) 0.5 MG tablet Take 1-2 mg by mouth at bedtime as needed.     . Amphetamine-Dextroamphetamine (ADDERALL PO) Take 2.5 mg by mouth daily as needed. Takes only a quarter of a 10 mg tablet a couple times per year.    Marland Kitchen buPROPion (WELLBUTRIN XL) 300 MG 24 hr tablet Take 300 mg by mouth daily.    . Cholecalciferol (VITAMIN D3) 50 MCG (2000 UT) TABS Take 1 tablet by mouth daily.     . valsartan (DIOVAN) 40 MG tablet Take 20 mg by mouth  daily.     . vitamin B-12 (CYANOCOBALAMIN) 1000 MCG tablet Take 1,000 mcg by mouth daily.     No current facility-administered medications for this visit.   Family History  Problem Relation Age of Onset  . Depression Mother        mild bipolar  . Heart attack Father 39  . Colon cancer Neg Hx    There is a family history of dementia. The patient's mother is demented; she has had several strokes. There is a family history of psychiatric illness. The patient's mother, her uncle, and all of their children have had significant issues with "manic depression."    Psychosocial History  Developmental, Educational and Employment History: The patient questions if she has a history of ADHD. She feels like she has grown out of it, more or less, although she  still takes Adderall several times per year to help with focus. She got on it the year she did her comprehensive exams because she was having a hard time focusing (she did read 300 books that year). She stated that she was always considered bright and participated in class but had a hard time getting her work done, she was somewhat hyperactive and impulsive. She had a hard time focusing. It sounds like teachers noticed her issues. She stated that she earned mainly B's in school but only wanted to go to competitive colleges so she didn't get in. 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 She went back to business school in her 16s and got an MBA from [employer]. 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 She decided to go back to school and earned a Ph.D. in History at [employer] and earned all A's. She has been working as a professor for the past 3 years.   Psychiatric History: The patient feels like  she has a history of mental health issues most of her life but had a hard time defining what they were. She feels like she couldn't fit in and had a hard time integrating into "the system." It sounds like she is accepting about but also has some misgivings about her ability to achieve and feels like some of that is related to potential ADHD. She had no formal treatment history until she started having issues during her comprehensive exams several years ago, when she started having a hard time performing. She got on Wellbutrin and has continued to take that. She was also taking Adderall periodically when she "really hit a wall," and she feels like that was helpful. She has no history of counseling.   Substance Use History: The patient has a limited history of substance use to cope with things. She had hepatitis in her 39s and reported that she cannot drink much as a result. The patient is a nonsmoker and has essentially never smoked.   Relationship History and Living Cimcumstances: The patient and her husband have been together since 1997 and have been married for about 12 years. She stated that is a good, supported relationship. She has no children.   Mental Status and Behavioral Observations  Sensorium/Arousal: The patient's level of arousal was awake and alert. Hearing and vision were adequate for testing purposes, although the patient does have some visual acuity issues and has a hard time reading. Orientation: The patient was fully oriented to person, place, time, and situation. She was aware of the current and past presidents.  Appearance: Dressed appropriately in casual clothing with good grooming and hygiene.  Behavior: The patient presented as somewhat stressed and scattered at times but was pleasant, appropriate, and a good historian.  Speech/language: Speech was somewhat fast in rate, normal in rhythm, volume, and prosody.  Gait/Posture: Gait appeared normal on observation of ambulation within  the clinic.  Movement: No obvious signs/symptoms of movement disorder on observation. Exam was benign with Dr. Rexene Alberts.  Social Comportment: Pleasant, appropriate Mood: "Completely overwhelmed"  Affect: Mood congruent Thought process/content: Thought process was logical, linear, and goal-oriented if not a bit expansive. Ms. Paz' thought content was appropriate to the evaluative context and she presented as a good historian.  Safety: No thoughts of harming self or others on direct questioning.  Insight: Atlee Abide Cognitive Assessment  10/11/2019  Visuospatial/ Executive (0/5) 5  Naming (0/3) 3  Attention: Read list of digits (0/2) 2  Attention: Read list of letters (0/1) 1  Attention: Serial 7 subtraction starting at 100 (0/3) 3  Language: Repeat phrase (0/2) 1  Language : Fluency (0/1) 1  Abstraction (0/2) 1  Delayed Recall (0/5) 4  Orientation (0/6) 6  Total 27  Adjusted Score (based on education) 27   Test Procedures  Wide Range Achievement Test - 4 Word Reading Reynolds' Intellectual Screening Test Wechsler Adult Intelligence Scale - IV  Digit Span  Arithmetic  Symbol Search  Coding Neuropsychological Assessment Battery  Memory Module  Visual Chiropractor ACS Word Choice The Dot Counting Test Controlled Oral Word Association (F-A-S) Conservation officer, historic buildings (Animals) Trail Making Test A & B Wisconsin Card Sorting Test - 64 Delis-Kaplan Executive Functions System  Color-word Interference Patient Health Questionnaire - 9 GAD-7  Plan  FREDDY SPADAFORA was seen for a psychiatric diagnostic evaluation and neuropsychological testing. She is reporting difficulties functioning at work, over the past 3 years, occurring in the setting of significant caregiver stress related to caretaking for her mother. She is still functioning well day-to-day although does not feel like she is working up to her full potential. She admits she can be a bit of a perfectionist and  has always had a hard time applying herself to things. She thinks she may have a history of ADHD. She is screening in the normal range and on preliminary review of her test data, there appear to be some findings suggestive of mild executive control problems. Full and complete note with impressions, recommendations, and interpretation of test data to follow.   Viviano Simas Nicole Kindred, PsyD, [place] Clinical Neuropsychologist  Informed Consent and Coding/Compliance  Risks and benefits of the evaluation were discussed with the patient prior to all testing procedures. I conducted a clinical interview and neuropsychological testing (at least two tests) with Malachi Bonds and Lamar Benes, B.S. (Technician) assisted me in administering additional test procedures. The patient was able to tolerate the testing procedures and the patient (and/or family if applicable) is likely to benefit from further follow up to receive the diagnosis and treatment recommendations, which will be rendered at the next encounter. Billing below reflects technician time, my direct face-to-face time with the patient, time spent in test administration, and time spent in professional activities including but not limited to: neuropsychological test interpretation, integration of neuropsychological test data with clinical history, report preparation, treatment planning, care coordination, and review of diagnostically pertinent medical history or studies.   Services associated with this encounter: Clinical Interview [phone]) plus 60 minutes ([id number]; Neuropsychological Evaluation by Professional)  155 minutes (92924; Neuropsychological Evaluation by Professional, Adl.) 30 minutes (46286; Test Administration by Professional) 30 minutes (38177; Neuropsychological Testing by Technician) 120 minutes (11657; Neuropsychological Testing by Technician, Adl.)

## 2019-10-12 ENCOUNTER — Telehealth: Payer: Self-pay | Admitting: Gastroenterology

## 2019-10-12 NOTE — Telephone Encounter (Signed)
Called patient back and told her that she would be fine to proceed with her colonoscopy. She drank 20 oz of red gatorade at noon today but procedure is not until tomorrow. I told her to drink as much water as possible to flush it out and that it should be fine.

## 2019-10-13 ENCOUNTER — Other Ambulatory Visit: Payer: Self-pay

## 2019-10-13 ENCOUNTER — Ambulatory Visit (AMBULATORY_SURGERY_CENTER): Payer: BC Managed Care – PPO | Admitting: Gastroenterology

## 2019-10-13 ENCOUNTER — Encounter: Payer: Self-pay | Admitting: Gastroenterology

## 2019-10-13 VITALS — BP 131/76 | HR 68 | Temp 97.5°F | Resp 13 | Ht 69.0 in | Wt 150.0 lb

## 2019-10-13 DIAGNOSIS — Z1211 Encounter for screening for malignant neoplasm of colon: Secondary | ICD-10-CM | POA: Diagnosis not present

## 2019-10-13 DIAGNOSIS — D123 Benign neoplasm of transverse colon: Secondary | ICD-10-CM | POA: Diagnosis not present

## 2019-10-13 DIAGNOSIS — K635 Polyp of colon: Secondary | ICD-10-CM

## 2019-10-13 MED ORDER — SODIUM CHLORIDE 0.9 % IV SOLN: 500.0000 mL | Freq: Once | INTRAVENOUS | Status: DC

## 2019-10-13 NOTE — Progress Notes (Signed)
Pt's states no medical or surgical changes since previsit or office visit.  Temp- 97.5 Vitals- Butch Penny

## 2019-10-13 NOTE — Progress Notes (Signed)
VO Lidocaine 2% 46mL with propofol for pt comfort.

## 2019-10-13 NOTE — Progress Notes (Signed)
[facility]  Patient Name: Natalie Li MRN: [id number] Date of Birth: 1960/07/01 Age: 59 y.o. Education: 20+ years  Measurement properties of test scores: IQ, Index, and Standard Scores (SS): Mean = 100; Standard Deviation = 15 Scaled Scores (Ss): Mean = 10; Standard Deviation = 3 Z scores (Z): Mean = 0; Standard Deviation = 1 T scores (T); Mean = 50; Standard Deviation = 10  TEST SCORES:    Note: This summary of test scores accompanies the interpretive report and should not be interpreted by unqualified individuals or in isolation without reference to the report. Test scores are relative to age, gender, and educational history as available and appropriate.   Performance Validity        ACS: Raw  Descriptor      Word Choice 50 Within Expectation       Raw  Descriptor  The Dot Counting Test: 9 Within Expectation      Embedded Measures: Raw  Descriptor      RBANS Effort Index 12 Below Expectation      WAIS-IV Reliable Digit Span 13 Within Expectation      WAIS-IV Reliable Digit Span Revised 19 Within Expectation      Mental Status Screening     Total Score Descriptor  MoCA 28 Normal      Expected Functioning        Wide Range Achievement Test: Standard/Scaled Score Percentile      Word Reading 116 86      Reynolds Intellectual Screening Test Standard/T-score Percentile      Guess What 62 89      Odd Item Out 56 73  RIST Index 117 88      Attention/Processing Speed        Wechsler Adult Intelligence Scale - IV: Standard/Scaled Score Percentile  Working Memory Index 102 55      Digit Span 14 91          Digit Span Forward 14 91          Digit Span Backward 14 91          Digit Span Sequencing 12 75      Arithmetic 7 16  Processing Speed Index 117 87      Symbol Search 12 75      Coding 14 91      Language        Neuropsychological Assessment Battery (Language Module, Form 1): T-score Percentile      Naming   (31) 55  69      Verbal Fluency:         Controlled Oral Word Association (F-A-S) 47 38      Semantic Fluency (Animals) 49 46      Memory:        Neuropsychological Assessment Battery (Memory Module, Form 1): T-score/Standard Score Percentile  Memory Index (MEM): 99 47      List Learning           List A Immediate Recall   (7 , 10 , 11) 54 66         List B Immediate Recall   (0) 19 <1         List A Short Delayed Recall   (8) 48 42         List A Long Delayed Recall   (8) 48 42         List A Long Delayed Yes/No Recognition Hits   (9) ---  5         List A Long Delayed Yes/No Recognition False Alarms   (2) --- 66         List A Recognition Discriminability Index --- 38      Shape Learning           Immediate Recognition   (7 , 7 , 8) 64 92         Delayed Recognition   (9) 66 95         Delayed Forced-Choice Recognition Hits   (8) --- 62         Delayed Forced-Choice Recognition False Alarms   (0) --- 75         Delayed Forced-Choice Recognition Discriminability --- 73     Story Learning           Immediate Recall   (14, 29) 28 2         Delayed Recall   (28) 36 8      Daily Living Memory            Immediate Recall   (27, 20) 56 73          Delayed Recall   (9, 6) 50 50          Recognition Hits   (10) --- 84      Visuospatial/Constructional Functioning        Wechsler Adult Intelligence Scale - IV Scaled Score Percentile       Block Design 1 <1      Neuropsychological Assessment Battery (Visuospatial Module) T-score Percentile      Visual Discrimination 49 Insurance risk surveyor        Wisconsin Card Sorting Test - 64: T-score Percentile      Categories --- 6-10 %ile      Total Errors 34 6      Perseverative Errors 35 7      Nonperseverative Errors 36 9      Conceptual Level Responses 32 4      D-KEFS Color-Word Interference Test: Raw (Scaled Score) Percentile     Color Naming 25 secs. (13) 84     Word Reading 19 secs. (13) 84      Inhibition 51 secs. (13) 84        Total Errors 0 errors (12) 75     Inhibition/Switching 75 secs. (10) 50        Total Errors 1 errors (12) 75      Trail Making Test: T-Score Percentile      Part A 74 99      Part B 65 93      Rating Scales         Raw Score Descriptor  Patient Health Questionnaire - 9 7 Mild Depression  GAD-7 12 Moderate Anxiety   Mildred Tuccillo V. Nicole Kindred PsyD, [place] Clinical Neuropsychologist

## 2019-10-13 NOTE — Progress Notes (Signed)
Per Dr. Havery Moros, he sent Honor Junes, RN a message to schedule pt for a CT of chest, abdomen, and pelvis d/t weight loss.  I gave pt the instruction set to drink the contrast and 2 bots of contrast.  No problems noted in the recovery room. maw

## 2019-10-13 NOTE — Progress Notes (Signed)
pt tolerated well. VSS. awake and to recovery. Report given to RN.  

## 2019-10-13 NOTE — Progress Notes (Signed)
Called to room to assist during endoscopic procedure.  Patient ID and intended procedure confirmed with present staff. Received instructions for my participation in the procedure from the performing physician.  

## 2019-10-13 NOTE — Patient Instructions (Signed)
YOU HAD AN ENDOSCOPIC PROCEDURE TODAY AT [facility]:   Refer to the procedure report that was given to you for any specific questions about what was found during the examination.  If the procedure report does not answer your questions, please call your gastroenterologist to clarify.  If you requested that your care partner not be given the details of your procedure findings, then the procedure report has been included in a sealed envelope for you to review at your convenience later.  YOU SHOULD EXPECT: Some feelings of bloating in the abdomen. Passage of more gas than usual.  Walking can help get rid of the air that was put into your GI tract during the procedure and reduce the bloating. If you had a lower endoscopy (such as a colonoscopy or flexible sigmoidoscopy) you may notice spotting of blood in your stool or on the toilet paper. If you underwent a bowel prep for your procedure, you may not have a normal bowel movement for a few days.  Please Note:  You might notice some irritation and congestion in your nose or some drainage.  This is from the oxygen used during your procedure.  There is no need for concern and it should clear up in a day or so.  SYMPTOMS TO REPORT IMMEDIATELY:   Following lower endoscopy (colonoscopy or flexible sigmoidoscopy):  Excessive amounts of blood in the stool  Significant tenderness or worsening of abdominal pains  Swelling of the abdomen that is new, acute  Fever of 100F or higher   For urgent or emergent issues, a gastroenterologist can be reached at any hour by calling [phone]. Do not use MyChart messaging for urgent concerns.    DIET:  We do recommend a small meal at first, but then you may proceed to your regular diet.  Drink plenty of fluids but you should avoid alcoholic beverages for 24 hours.  ACTIVITY:  You should plan to take it easy for the rest of today and you should NOT DRIVE or use heavy machinery until tomorrow (because  of the sedation medicines used during the test).    FOLLOW UP: Our staff will call the number listed on your records 48-72 hours following your procedure to check on you and address any questions or concerns that you may have regarding the information given to you following your procedure. If we do not reach you, we will leave a message.  We will attempt to reach you two times.  During this call, we will ask if you have developed any symptoms of COVID 19. If you develop any symptoms (ie: fever, flu-like symptoms, shortness of breath, cough etc.) before then, please call [phone].  If you test positive for Covid 19 in the 2 weeks post procedure, please call and report this information to [place].    If any biopsies were taken you will be contacted by phone or by letter within the next 1-3 weeks.  Please call us at [phone] if you have not heard about the biopsies in 3 weeks.    SIGNATURES/CONFIDENTIALITY: You and/or your care partner have signed paperwork which will be entered into your electronic medical record.  These signatures attest to the fact that that the information above on your After Visit Summary has been reviewed and is understood.  Full responsibility of the confidentiality of this discharge information lies with you and/or your care-partner.     Handouts were given to you on polyps, diverticulosis, hemorrhoids, and CT instruction sheet to  be completed after you are given date and time for CT of Chest, Abdomen and Pelvis with contrast.  Also two bottles of oral contrast were given to the patient. You may resume your current medications today. Await biopsy results. Please call if any questions or concerns.

## 2019-10-13 NOTE — Op Note (Signed)
[facility] Patient Name: Natalie Li Procedure Date: 10/13/2019 10:07 AM MRN: [id number] Endoscopist: Remo Lipps P. Havery Moros , MD Age: 59 Referring MD:  Date of Birth: [birth year] Gender: Female Account #: [id number] Procedure:                Colonoscopy Indications:              Screening for colorectal malignant neoplasm Medicines:                Monitored Anesthesia Care Procedure:                Pre-Anesthesia Assessment:                           - Prior to the procedure, a History and Physical                            was performed, and patient medications and                            allergies were reviewed. The patient's tolerance of                            previous anesthesia was also reviewed. The risks                            and benefits of the procedure and the sedation                            options and risks were discussed with the patient.                            All questions were answered, and informed consent                            was obtained. Prior Anticoagulants: The patient has                            taken no previous anticoagulant or antiplatelet                            agents. ASA Grade Assessment: II - A patient with                            mild systemic disease. After reviewing the risks                            and benefits, the patient was deemed in                            satisfactory condition to undergo the procedure.                           After obtaining informed consent, the colonoscope  was passed under direct vision. Throughout the                            procedure, the patient's blood pressure, pulse, and                            oxygen saturations were monitored continuously. The                            Colonoscope was introduced through the anus and                            advanced to the the terminal ileum, with                            identification of the  appendiceal orifice and IC                            valve. The colonoscopy was technically difficult                            and complex due to a tortuous colon. The patient                            tolerated the procedure well. The quality of the                            bowel preparation was adequate. The terminal ileum,                            ileocecal valve, appendiceal orifice, and rectum                            were photographed. Scope In: 10:18:05 AM Scope Out: 10:54:02 AM Scope Withdrawal Time: 0 hours 27 minutes 1 second  Total Procedure Duration: 0 hours 35 minutes 57 seconds  Findings:                 The perianal and digital rectal examinations were                            normal.                           The terminal ileum appeared normal.                           Two sessile polyps were found in the transverse                            colon. The polyps were 3 to 6 mm in size. These                            polyps were removed with a cold snare. Resection  and retrieval were complete.                           A few medium-mouthed diverticula were found in the                            left colon.                           The colon was significantly tortuous with multiple                            angulated turns, which prolonged the exam.                           Internal hemorrhoids were found during retroflexion.                           The exam was otherwise without abnormality. Complications:            No immediate complications. Estimated blood loss:                            Minimal. Estimated Blood Loss:     Estimated blood loss was minimal. Impression:               - The examined portion of the ileum was normal.                           - Two 3 to 6 mm polyps in the transverse colon,                            removed with a cold snare. Resected and retrieved.                           - Diverticulosis in  the left colon.                           - Tortuous colon.                           - Internal hemorrhoids.                           - The examination was otherwise normal. Recommendation:           - Patient has a contact number available for                            emergencies. The signs and symptoms of potential                            delayed complications were discussed with the                            patient. Return to normal activities tomorrow.  Written discharge instructions were provided to the                            patient.                           - Resume previous diet.                           - Continue present medications.                           - Await pathology results. Remo Lipps P. Rylon Poitra, MD 10/13/2019 10:58:20 AM This report has been signed electronically.

## 2019-10-14 ENCOUNTER — Telehealth: Payer: Self-pay

## 2019-10-14 DIAGNOSIS — R634 Abnormal weight loss: Secondary | ICD-10-CM

## 2019-10-14 NOTE — Telephone Encounter (Signed)
-----   Message from Yetta Flock, MD sent at 10/13/2019 10:58 AM EDT ----- Regarding: CT scans Natalie Li, Can you call this patient in the next few days to schedule CT chest / abdomen / pelvis with contrast for progressive weight loss? Thanks. Don't worry about it today, just did her colonoscopy. Thanks

## 2019-10-14 NOTE — Telephone Encounter (Signed)
Patient notified of the CT scan at [facility] CT for 10/20/19 3:00.  She is asked to arrive at 2:45 .  Instructions placed at the front desk for her to pick up when she comes for the labs next week.

## 2019-10-17 ENCOUNTER — Telehealth: Payer: Self-pay | Admitting: *Deleted

## 2019-10-17 ENCOUNTER — Other Ambulatory Visit (INDEPENDENT_AMBULATORY_CARE_PROVIDER_SITE_OTHER): Payer: BC Managed Care – PPO

## 2019-10-17 DIAGNOSIS — R634 Abnormal weight loss: Secondary | ICD-10-CM | POA: Diagnosis not present

## 2019-10-17 LAB — BUN: BUN: 13 mg/dL (ref 6–23)

## 2019-10-17 LAB — CREATININE, SERUM: Creatinine, Ser: 0.62 mg/dL (ref 0.40–1.20)

## 2019-10-17 NOTE — Telephone Encounter (Signed)
  Follow up Call-  Call back number 10/13/2019  Post procedure Call Back phone  # [phone]  Permission to leave phone message Yes  Some recent data might be hidden     Patient questions:  Do you have a fever, pain , or abdominal swelling? No. Pain Score  0 *  Have you tolerated food without any problems? Yes.    Have you been able to return to your normal activities? Yes.    Do you have any questions about your discharge instructions: Diet   No. Medications  No. Follow up visit  No.  Do you have questions or concerns about your Care? No.  Actions: * If pain score is 4 or above: No action needed, pain <4.  1. Have you developed a fever since your procedure? no  2.   Have you had an respiratory symptoms (SOB or cough) since your procedure? no  3.   Have you tested positive for COVID 19 since your procedure no  4.   Have you had any family members/close contacts diagnosed with the COVID 19 since your procedure?  no   If yes to any of these questions please route to Joylene John, RN and Erenest Rasher, RN

## 2019-10-19 ENCOUNTER — Other Ambulatory Visit: Payer: Self-pay

## 2019-10-19 ENCOUNTER — Telehealth (INDEPENDENT_AMBULATORY_CARE_PROVIDER_SITE_OTHER): Payer: BC Managed Care – PPO | Admitting: Counselor

## 2019-10-19 ENCOUNTER — Telehealth: Payer: Self-pay | Admitting: Cardiology

## 2019-10-19 ENCOUNTER — Encounter: Payer: Self-pay | Admitting: Counselor

## 2019-10-19 ENCOUNTER — Encounter: Payer: Self-pay | Admitting: Gastroenterology

## 2019-10-19 DIAGNOSIS — F4323 Adjustment disorder with mixed anxiety and depressed mood: Secondary | ICD-10-CM

## 2019-10-19 DIAGNOSIS — F902 Attention-deficit hyperactivity disorder, combined type: Secondary | ICD-10-CM

## 2019-10-19 NOTE — Progress Notes (Signed)
[facility]  Patient Name: Natalie Li MRN: [id number] Date of Birth: 07-Aug-1960 Age: 59 y.o. Education: 20+ years  Clinical Impressions  Natalie Li is a 59 y.o., left-hand dominant, married woman with a history of significant caregiver burden over the past 5 years (she is caring for her mother who had a number of strokes). She also suspects that she has ADHD and has had a hard time focusing, fitting in, and applying herself for most of her life. She admits that she is under a tremendous amount of stress and frequently feels "overwhelmed" and "trapped." She has been having difficulties keeping up with her duties at work. She earned a Ph.D. 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 She feels like her written communication is poor, she sometimes mixes up words, and she has had some errors likely due to executive control problems as detailed below. She admits that often times, her care giving responsibilities do not allow her to spend the time she would like on her work. Her husband is not concerned. She has an MRI of the brain that appears normal.  Neuropsychological testing shows mild executive control difficulties that fit well with the patient's day-to-day report of symptoms. Specifically, she had difficulties on the [employer], a measure involving concept formation and ideational generativity. Her memory performance was less than expected but that is due to inconsistent performance and as such is unlikely to represent a primary memory problem. Her overall memory performance fell at a reasonable average level, which is normal for her. She performed very well on measures of visuospatial/constructional functioning and speed of processing and performed adequately on measures of attention and working memory. She scored in the  moderate range for anxiety symptoms and the mild range for depressive symptoms.   Ms. Dietz' profile is thus unconcerning for the presence of an incipient degenerative condition; rather, it suggests that her day-to-day problems are most likely on the basis of excessive stress and demands on her time in the setting of ADHD. Poor sleep pattern and side effects from medication are also possible contributors. I will counsel her on strategies to prevent caregiver burnout, see if resources for her mother may be helpful, and suggest that she consider specialty consultation for treatment of her ADHD.   Diagnostic Impressions: Attention/Deficit-hyperactivity disorder Adjustment disorder with mixed anxiety and depressed mood Caregiver burnout  Recommendations to be discussed with patient  Your performance and presentation were consistent with some low scores that can be summed up as reflecting mild difficulties with aspects of executive control. These types of problems are common in ADHD, depression/anxiety, and I think those more than adequately explain your day-to-day symptoms. I am not concerned about the presence of an incipient degenerative condition.   Many aspects of your presentation make me think that you may be having problems with something called "executive control," which is a higher order cognitive ability involved in regulating other cognitive resources. Much like the conductor of an orchestra coordinates multiple instruments to make music, executive capacities coordinate other lower-order skills (e.g., movement, language, attention) to form complex human behaviors. Individuals with executive control problems are often capable of doing most of the things they did before they were having problems, but they may not do so as effortlessly, efficiently, and consistently. These difficulties often manifest as problems tracking  information, multitasking, and paying attention. Executive control problems  often result in cognitive inefficiency and can present as "memory problems," because they decrease encoding and spontaneous retrieval of information.   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 Caregiver burnout can lead to depression. It is also self-defeating, because it can interfere with a caregiver's ability to care for others. There is research that mortality in spouses who provide care to a demented loved one may be higher than for the individual with dementia themselves. For all these reasons, I recommend that you to actively manage your stress level and take as good care of yourself as you do of your loved one. Strategies that may help include:  . Splitting caregiving responsibilities amongst multiple family members. . Getting paid supports so that you can take some time to yourself. . Aggressively treating depression and anxiety that are understandable reactions to the stress of caregiving. This may include counseling or antidepressant medication depending on your symptoms and treatment of choice.  . Even something simple such as scheduling small periods of time every day where you can engage in a desired hobby or pleasurable activity can be extremely helpful.   For problems with attention and concentration, consider the following recommendations: . Build routines into your day and stick to them, doing the same thing at the same time helps your body get into a rhythm that makes it harder to forget something you need to do.  . Use sticky notes, reminders, a calendar, or your smart phone to provide yourself with reminders, to do lists, and help track  appointments.  . When you need to do work for school or other things, create an environment that is conducive to that work. This may include putting electronic devices that can be distracting outside the room and working in an area that is quiet and free from distractions.  . Break things down into smaller steps to help get started and stop yourself from feeling overwhelmed.  . Plan breaks throughout the day where you can get up and move even if it's for just a few minutes at a time.  . Focus your attention on only one thing and avoid multitasking. Although some people are better at it than others, nobody's task performance is as good as it could be when they are alternating attention between two different tasks.  . Stay mindful throughout your day and monitor whether you are feeling overwhelmed or disorganized to identify problems before they happen.  . Avoid working under time pressure when you may be more liable to make mistakes.   Perhaps most importantly, be patient with yourself. Realize that there are only so many hours in the day and that, when there are very significant demands on your time, it is not abnormal to feel scattered, have difficulties focusing, and feel like your work is not at its best. I do think that your history is fairly convincing for ADHD and oftentimes, ADHD is associated with perfectionistic tendencies that can cause people to evaluate themselves more negatively than is objectively warranted. Having ADHD also makes managing excessive demands more challenging, which can start a vicious cycle because people with ADHD also typically do not function  well when they are feeling overwhelmed. Realize that most people do not function well with a disturbed sleep schedule, inadequate time for self-care, and constant interruptions. This is not a sign of an impending decline or that there is necessarily another cause for your memory and thinking problems.    It would be reasonable to  consider treating your ADHD, either with medication or behavioral strategies. The above behavioral strategies may be helpful but could also potentially be enhanced by working with a professional skilled in behavioral management strategies, such as an "ADHD coach." I have received positive reviews from patients about [facility], who can be reached at [phone]  Test Findings  Test scores are summarized in additional documentation associated with this encounter. Test scores are relative to age, gender, and educational history as available and appropriate. There were no concerns about performance validity as all findings fell within normal expectations.   General Intellectual Functioning/Achievement:  Performance on single word reading was high average. Performance on the RIST index was also high average, and therefore high average presents as a reasonable standard of comparison for Ms. Mealor' cognitive test data. She demonstrated high average (nearly superior) performance on the verbal portion of the RIST and average (nearly high average) performance on the more visually oriented portion.   Attention and Processing Efficiency: Performance on indicators of attention and working memory fell at a reasonable, average level on the Working Memory Index of the WAIS-IV. Digit repetition forward and backward were superior. Digit resequencing in ascending order was high average. Ms. Kivi had more difficulty and performed at a low average level solving arithmetical word problems without paper and pencil.    With respect to processing efficiency, her performance was very strong with a high average scores on the Processing Speed Index of the WAIS-IV with superior timed number-symbol coding and average performance on a symbol matching to sample task emphasizing efficient visual scanning and efficient visual matching.   Language: Ms. Paternoster' visual object confrontation naming was normal.  Generation of words in response to the letters F-A-S and the category prompt "animals" was average.   Visuospatial Function: Performance was very strong on visuospatial and constructional measures with an average score on a measure of fine visual discrimination and attention to detail. Assembly of two-dimensional target designs from a series of small plastic tile pieces was extremely good and fell at a very superior level.    Learning and Memory: Performance on learning and memory measures suggests executive control issues. The number of low subtest scores is unusual for a person of high average ability but the discrepancy between her RIST index and the NAB Memory Index (MEM) is not. Ms. Rota thus demonstrated intact memory performance albeit with some inconsistency in performance.   In the verbal realm, Dr. Inda Merlin learned 7, 10, and 11 words, which is average. She had difficulty recalling a distractor alternate list presented in between immediate and short delayed recall trials, suggesting some susceptibility to proactive interference, although she achieved an average score for delayed free recall of the target list. Yes/no recognition discriminability was average. She had more difficulties with a short story on which her immediate recall was extremely low but once again, she retained that information well across time and her delayed recall score was unusually low (almost low average). Memory for brief daily-living type information was average on immediate and delayed recall with high average recognition.   In the visual realm, Dr. Inda Merlin had very good superior range  scores when learning and then recalling a series of visual designs that is difficult to verbally encode. Recognition memory using a yes/no recognition format was average.  Executive Functions: Performance was weak on select executive tasks and Dr. Inda Merlin' memory profile is also suggestive of executive control problems. She completed a low  average (nearly unusually low) level of solution categories on the [facility] and did so with unusually low scores for total errors and perseverative errors. She did better elsewhere, with superior alternating sequencing of numbers and letters of the alphabet. Generation of words in response to the letters F-A-S was average. She did well when inhibiting a dominant response infavor of a novel response on the stroop test and when switching between response sets.   Rating Scale(s): Ms. Massi screened in the moderate range for anxiety symptoms and in the mild range for depressive symptoms.   Viviano Simas Nicole Kindred PsyD, [place] Clinical Neuropsychologist

## 2019-10-19 NOTE — Telephone Encounter (Signed)
      I went in pt;'s chart to see who had called pt today. I could not see where anybody from here called.

## 2019-10-19 NOTE — Progress Notes (Signed)
[facility]  Telemedicine statement:  I discussed the limitations of neuropsychological care via telemedicine and the availability of in person appointments. The patient expressed understanding and agreed to proceed. The patient was verified with two identifiers.  The patient location was: home The provider location was: office  Feedback Note: I met with [facility] to review the findings resulting from her neuropsychological evaluation. Since the last appointment, she has been doing somewhat better. She has had a number of stressors that have resolved or diminished, which is positively affecting her perceived level of burden. Time was spent reviewing the impressions and recommendations that are detailed in the evaluation report. We discussed impression of fairly benign cognitive performance, albeit with some areas of low scores likely reflecting executive control problems. Her history is fairly convincing for ADHD but she also has a great deal of caregiver burnout, is not sleeping well, and had many demands on her time. She was offered and accepted a referral for psychotherapy, we also discussed ADHD coaching, and perhaps most importantly relieving some of the caregiving responsibilities on her. She is working towards getting more time for herself and may involve her family in some caretaking, although there are family dynamics there rendering that challenging. This may be an area of benefit to work on in psychotherapy. I took time to explain the findings and answer all the patient's questions. I encouraged Natalie Li to contact me should she have any further questions or if further follow up is desired.   Current Medications and Medical History   Current Outpatient Medications  Medication Sig Dispense Refill   ALPRAZolam (XANAX) 0.5 MG tablet Take 1-2 mg by mouth at bedtime as needed.      Amphetamine-Dextroamphetamine (ADDERALL PO) Take 2.5 mg by mouth  daily as needed. Takes only a quarter of a 10 mg tablet a couple times per year. (Patient not taking: Reported on 10/13/2019)     buPROPion (WELLBUTRIN XL) 300 MG 24 hr tablet Take 300 mg by mouth daily.     Cholecalciferol (VITAMIN D3) 50 MCG (2000 UT) TABS Take 1 tablet by mouth daily.      valsartan (DIOVAN) 40 MG tablet Take 20 mg by mouth daily.      vitamin B-12 (CYANOCOBALAMIN) 1000 MCG tablet Take 1,000 mcg by mouth daily.     No current facility-administered medications for this visit.    There are no problems to display for this patient.  Mental Status and Behavioral Observations  Natalie Li was available at the prespecified time for this telemedicine appointment and was alert and generally oriented (orientation not formally assessed). Speech was normal in rate, rhythm, volume, and prosody. Self-reported mood was "better" and affect as assessed by vocal quality was neutral to euthymic. Thought process was logical, linear, and goal oriented and thought content was appropriate. There were no safety concerns identified at today's encounter, such as thoughts of harming self or others.   Plan  Feedback provided regarding the patient's neuropsychological evaluation. She is relieved that she does not appear to be developing dementia and is aware that the extreme level of stress she is under is likely impacting her cognitive abilities. She is also getting a fully body CT scan to check for metastasis due to weight loss. She is an excellent candidate for psychotherapy and will continue to work on setting boundaries and clarifying goals in that venue. Natalie Li was encouraged to contact me if any questions arise or if  further follow up is desired.   Viviano Simas Nicole Kindred, PsyD, ABN Clinical Neuropsychologist  Service(s) Provided at This Encounter: 69 minutes [phone]; Psychotherapy with patient/family)

## 2019-10-19 NOTE — Patient Instructions (Signed)
Your performance and presentation were consistent with some low scores that can be summed up as reflecting mild difficulties with aspects of executive control. These types of problems are common in ADHD, depression/anxiety, and I think those more than adequately explain your day-to-day symptoms. I am not concerned about the presence of an incipient degenerative condition.   Many aspects of your presentation make me think that you may be having problems with something called "executive control," which is a higher order cognitive ability involved in regulating other cognitive resources. Much like the conductor of an orchestra coordinates multiple instruments to make music, executive capacities coordinate other lower-order skills (e.g., movement, language, attention) to form complex human behaviors. Individuals with executive control problems are often capable of doing most of the things they did before they were having problems, but they may not do so as effortlessly, efficiently, and consistently. These difficulties often manifest as problems tracking information, multitasking, and paying attention. Executive control problems often result in cognitive inefficiency and can present as "memory problems," because they decrease encoding and spontaneous retrieval of information.   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 Caregiver burnout can lead to depression. It is also self-defeating, because it can interfere with a caregiver's ability to care for others. There is research that mortality in spouses who provide care to a demented loved one may be higher than for the individual  with dementia themselves. For all these reasons, I recommend that you to actively manage your stress level and take as good care of yourself as you do of your loved one. Strategies that may help include:   Splitting caregiving responsibilities amongst multiple family members.  Getting paid supports so that you can take some time to yourself.  Aggressively treating depression and anxiety that are understandable reactions to the stress of caregiving. This may include counseling or antidepressant medication depending on your symptoms and treatment of choice.   Even something simple such as scheduling small periods of time every day where you can engage in a desired hobby or pleasurable activity can be extremely helpful.   For problems with attention and concentration, consider the following recommendations:  Build routines into your day and stick to them, doing the same thing at the same time helps your body get into a rhythm that makes it harder to forget something you need to do.   Use sticky notes, reminders, a calendar, or your smart phone to provide yourself with reminders, to do lists, and help track appointments.   When you need to do work for school or other things, create an environment that is conducive to that work. This may include putting electronic devices that can be distracting outside the room and working in an area that is quiet and free from distractions.   Break things down into smaller steps to help get started and stop yourself from feeling overwhelmed.   Plan breaks throughout the day where you can get up and move even if it's for just a few minutes at a time.   Focus your attention on only one thing and avoid multitasking. Although some people are better at it than others, nobody's task performance is as good as it  could be when they are alternating attention between two different tasks.   Stay mindful throughout your day and monitor whether you are feeling overwhelmed  or disorganized to identify problems before they happen.   Avoid working under time pressure when you may be more liable to make mistakes.   Perhaps most importantly, be patient with yourself. Realize that there are only so many hours in the day and that, when there are very significant demands on your time, it is not abnormal to feel scattered, have difficulties focusing, and feel like your work is not at its best. I do think that your history is fairly convincing for ADHD and oftentimes, ADHD is associated with perfectionistic tendencies that can cause people to evaluate themselves more negatively than is objectively warranted. Having ADHD also makes managing excessive demands more challenging, which can start a vicious cycle because people with ADHD also typically do not function well when they are feeling overwhelmed. Realize that most people do not function well with a disturbed sleep schedule, inadequate time for self-care, and constant interruptions. This is not a sign of an impending decline or that there is necessarily another cause for your memory and thinking problems.    It would be reasonable to consider treating your ADHD, either with medication or behavioral strategies. The above behavioral strategies may be helpful but could also potentially be enhanced by working with a professional skilled in behavioral management strategies, such as an "ADHD coach." I have received positive reviews from patients about [facility], who can be reached at [phone]  We discussed a referral for psychotherapy to work through some of your caregiving and family dynamic issues and also perhaps for enhanced behavioral treatment of ADHD, which I have made. [facility] should be in touch about scheduling an appointment.

## 2019-10-20 ENCOUNTER — Ambulatory Visit (INDEPENDENT_AMBULATORY_CARE_PROVIDER_SITE_OTHER)
Admission: RE | Admit: 2019-10-20 | Discharge: 2019-10-20 | Disposition: A | Discharge status: Home / Self Care | Payer: BC Managed Care – PPO | Source: Ambulatory Visit | Attending: Gastroenterology | Admitting: Gastroenterology

## 2019-10-20 DIAGNOSIS — R1030 Lower abdominal pain, unspecified: Secondary | ICD-10-CM | POA: Diagnosis not present

## 2019-10-20 DIAGNOSIS — R634 Abnormal weight loss: Secondary | ICD-10-CM | POA: Diagnosis not present

## 2019-10-20 MED ORDER — IOHEXOL 300 MG/ML  SOLN
100.0000 mL | Freq: Once | INTRAMUSCULAR | Status: AC | PRN
  Administered 2019-10-20: 100 mL via INTRAVENOUS

## 2019-10-21 ENCOUNTER — Telehealth: Payer: Self-pay | Admitting: Gastroenterology

## 2019-10-21 ENCOUNTER — Telehealth: Payer: Self-pay | Admitting: Cardiology

## 2019-10-21 NOTE — Telephone Encounter (Signed)
Pt called back to inquire about results.

## 2019-10-21 NOTE — Telephone Encounter (Signed)
Patient requesting results of CT

## 2019-10-21 NOTE — Telephone Encounter (Signed)
Not an established [facility] pt. Pt has already call ordering provider's office for results. Will this message to send to ordering provider, Dr. Doyne Keel office.

## 2019-10-21 NOTE — Telephone Encounter (Signed)
Pt is requesting a call back regarding her CT results.

## 2019-10-21 NOTE — Telephone Encounter (Signed)
Patient is requesting results from CT completed on 10/20/19. Please call

## 2019-10-23 NOTE — Telephone Encounter (Signed)
Natalie Li can you please let this patient know her CT scans of C/A/P are all NORMAL, I don't see anything concerning to cause her weight loss, which could be due to significant life stressors she has been dealing with over the past year. Recommend she eat 3 full meals per day, monitor weight over the next 1-2 months and see how things go. Her imaging, labs, and colonoscopy are normal. If she continues to lose weight she needs to call us back

## 2019-10-24 NOTE — Telephone Encounter (Signed)
The pt has been advised and all questions answered  °

## 2019-12-06 DIAGNOSIS — Z03818 Encounter for observation for suspected exposure to other biological agents ruled out: Secondary | ICD-10-CM | POA: Diagnosis not present

## 2019-12-06 DIAGNOSIS — Z20822 Contact with and (suspected) exposure to covid-19: Secondary | ICD-10-CM | POA: Diagnosis not present

## 2019-12-14 DIAGNOSIS — R5383 Other fatigue: Secondary | ICD-10-CM | POA: Diagnosis not present

## 2019-12-27 DIAGNOSIS — L821 Other seborrheic keratosis: Secondary | ICD-10-CM | POA: Diagnosis not present

## 2019-12-30 DIAGNOSIS — H2513 Age-related nuclear cataract, bilateral: Secondary | ICD-10-CM | POA: Diagnosis not present

## 2019-12-30 DIAGNOSIS — H524 Presbyopia: Secondary | ICD-10-CM | POA: Diagnosis not present

## 2020-01-11 ENCOUNTER — Telehealth: Payer: Self-pay | Admitting: Gastroenterology

## 2020-01-11 DIAGNOSIS — Z6820 Body mass index (BMI) 20.0-20.9, adult: Secondary | ICD-10-CM | POA: Diagnosis not present

## 2020-01-11 DIAGNOSIS — N644 Mastodynia: Secondary | ICD-10-CM | POA: Diagnosis not present

## 2020-01-11 DIAGNOSIS — Z1231 Encounter for screening mammogram for malignant neoplasm of breast: Secondary | ICD-10-CM | POA: Diagnosis not present

## 2020-01-11 DIAGNOSIS — R102 Pelvic and perineal pain: Secondary | ICD-10-CM | POA: Diagnosis not present

## 2020-01-11 DIAGNOSIS — M79622 Pain in left upper arm: Secondary | ICD-10-CM | POA: Diagnosis not present

## 2020-01-11 DIAGNOSIS — Z01419 Encounter for gynecological examination (general) (routine) without abnormal findings: Secondary | ICD-10-CM | POA: Diagnosis not present

## 2020-01-11 NOTE — Telephone Encounter (Signed)
Patient is calling, asking if the results from the imaging she had done and the results of the barrium swallow can be sent to her doctor, Dr. Lois Huxley.

## 2020-01-11 NOTE — Telephone Encounter (Signed)
Spoke with patient, she is requesting that we fax her CT results to her OB/GYN - Dr. Murrell Redden at [facility]. Pt wanted to know if this will keep her from having to have a mammogram this year. Faxed imaging results today.

## 2020-01-31 DIAGNOSIS — Z20828 Contact with and (suspected) exposure to other viral communicable diseases: Secondary | ICD-10-CM | POA: Diagnosis not present

## 2020-02-13 DIAGNOSIS — Z0000 Encounter for general adult medical examination without abnormal findings: Secondary | ICD-10-CM | POA: Diagnosis not present

## 2020-02-13 DIAGNOSIS — E785 Hyperlipidemia, unspecified: Secondary | ICD-10-CM | POA: Diagnosis not present

## 2020-02-20 DIAGNOSIS — Z0000 Encounter for general adult medical examination without abnormal findings: Secondary | ICD-10-CM | POA: Diagnosis not present

## 2020-02-20 DIAGNOSIS — I10 Essential (primary) hypertension: Secondary | ICD-10-CM | POA: Diagnosis not present

## 2020-02-20 DIAGNOSIS — Z1331 Encounter for screening for depression: Secondary | ICD-10-CM | POA: Diagnosis not present

## 2020-02-25 DIAGNOSIS — Z23 Encounter for immunization: Secondary | ICD-10-CM | POA: Diagnosis not present

## 2020-05-13 DIAGNOSIS — Z20822 Contact with and (suspected) exposure to covid-19: Secondary | ICD-10-CM | POA: Diagnosis not present

## 2020-05-19 DIAGNOSIS — Z20822 Contact with and (suspected) exposure to covid-19: Secondary | ICD-10-CM | POA: Diagnosis not present

## 2020-05-27 DIAGNOSIS — Z20822 Contact with and (suspected) exposure to covid-19: Secondary | ICD-10-CM | POA: Diagnosis not present

## 2020-06-03 DIAGNOSIS — Z20822 Contact with and (suspected) exposure to covid-19: Secondary | ICD-10-CM | POA: Diagnosis not present

## 2020-08-28 DIAGNOSIS — R1013 Epigastric pain: Secondary | ICD-10-CM | POA: Diagnosis not present

## 2020-08-28 DIAGNOSIS — F900 Attention-deficit hyperactivity disorder, predominantly inattentive type: Secondary | ICD-10-CM | POA: Diagnosis not present

## 2021-09-09 IMAGING — CT CT ABD-PELV W/ CM
2 of 5 series · 14 of 46 positions shown, 16 images · IV contrast (OMNIPAQUE 300)
Comparison: None.

CLINICAL DATA: 40 lb weight loss over past 2 years. Lower abdominal
and pelvic discomfort for several weeks. Diarrhea.

EXAM:
CT CHEST, ABDOMEN, AND PELVIS WITH CONTRAST
TECHNIQUE: Multidetector CT imaging of the chest, abdomen and pelvis was
performed following the standard protocol during bolus
administration of intravenous contrast.
CONTRAST:  100mL OMNIPAQUE IOHEXOL 300 MG/ML  SOLN

[Series 2: cap with · axial · 0.64mm/px · z∈[-643,-63]mm · 11 of 140 slices shown, 13 images]
[im 12/140  soft-tissue]
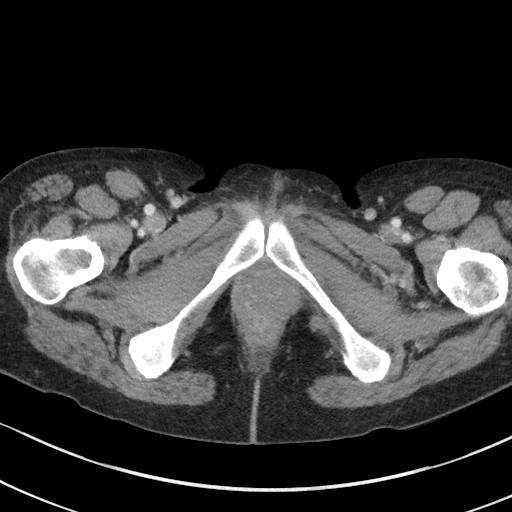
[im 12/140  bone]
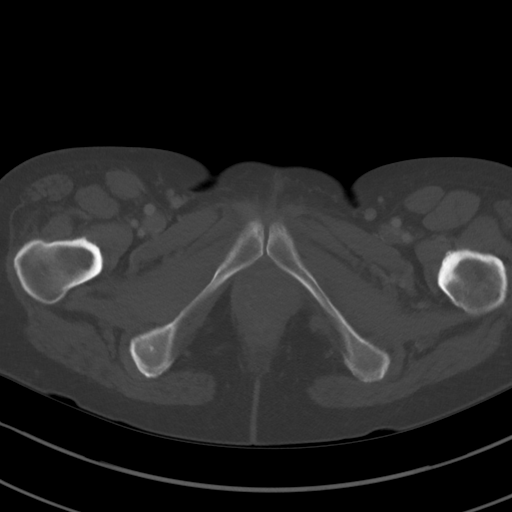
[im 24/140  soft-tissue]
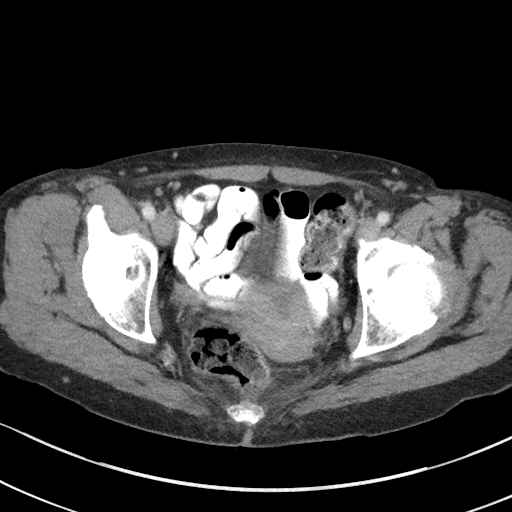
[im 35/140  soft-tissue]
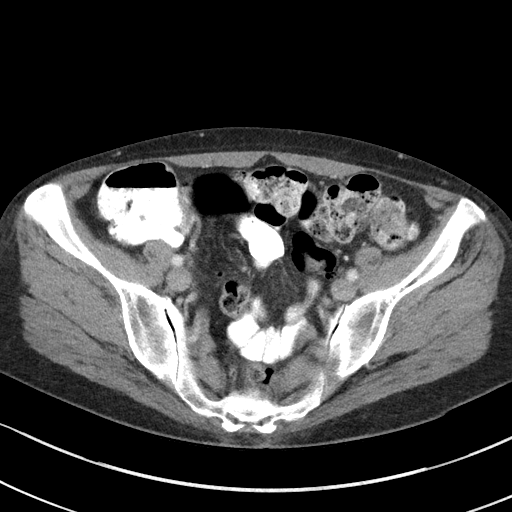
[im 47/140  soft-tissue]
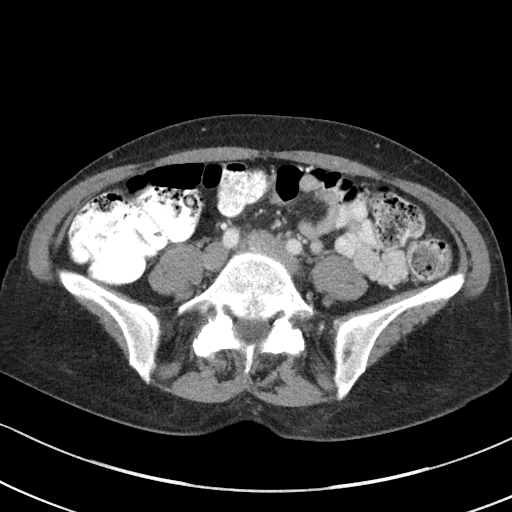
[im 58/140  soft-tissue]
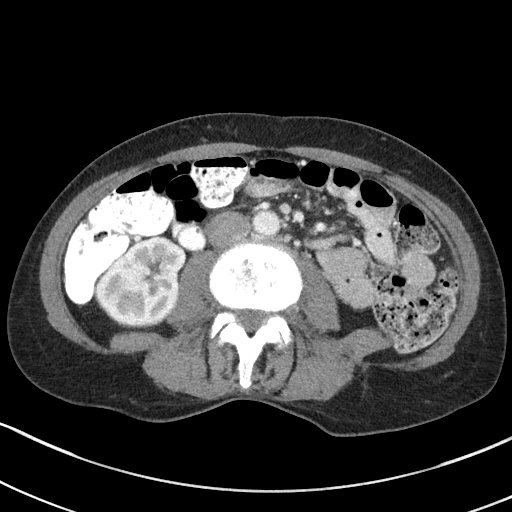
[im 70/140  soft-tissue]
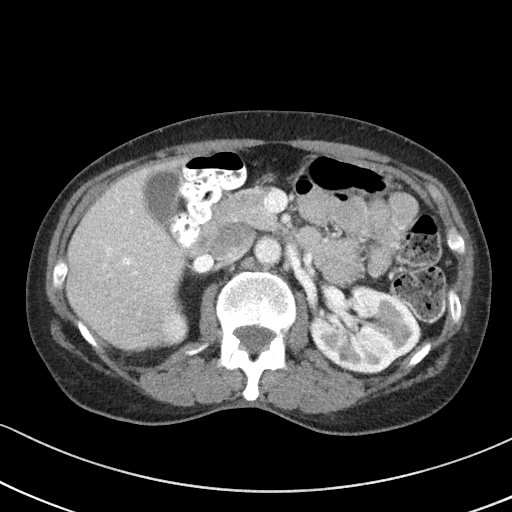
[im 82/140  soft-tissue]
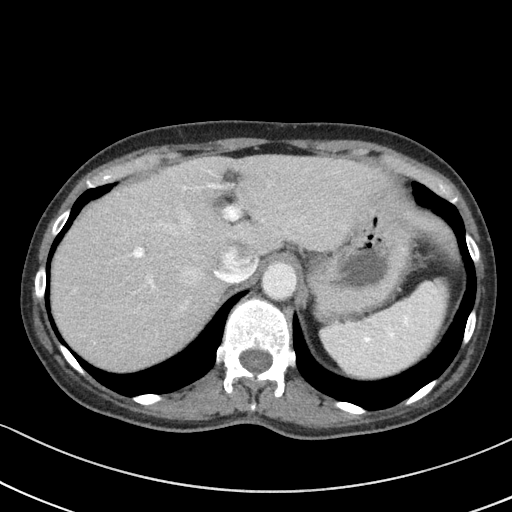
[im 93/140  soft-tissue]
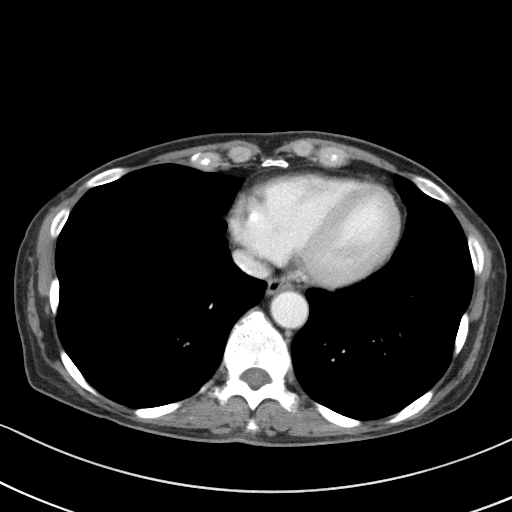
[im 105/140  soft-tissue]
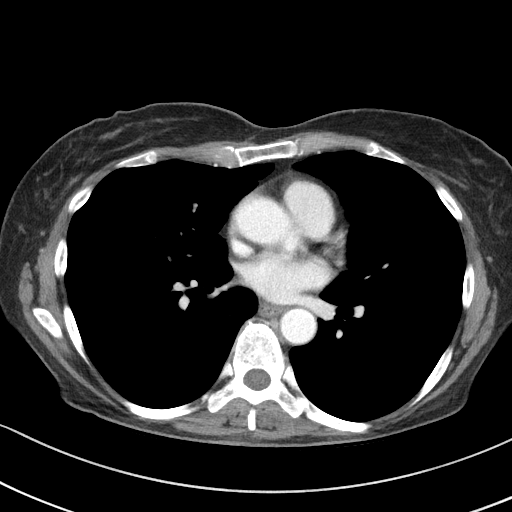
[im 105/140  bone]
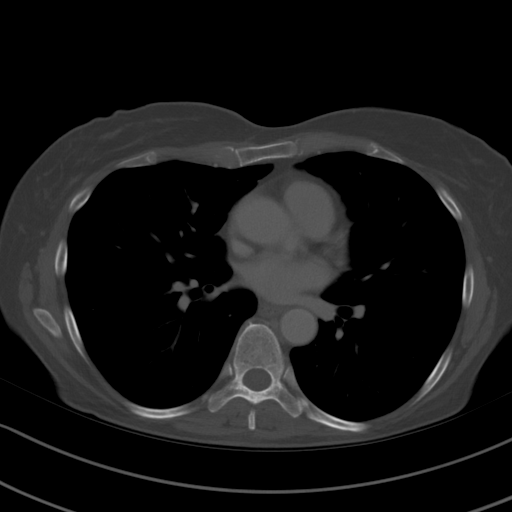
[im 116/140  soft-tissue]
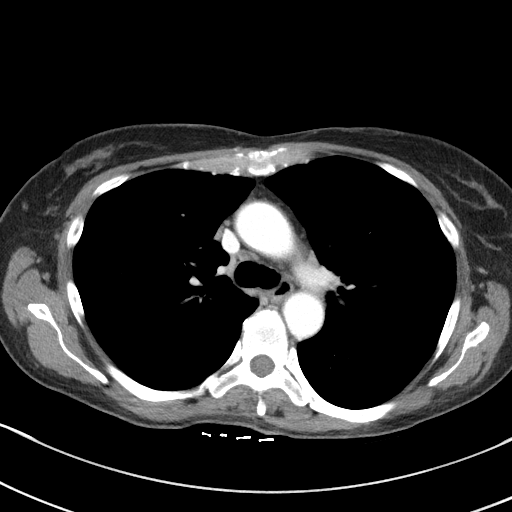
[im 128/140  soft-tissue]
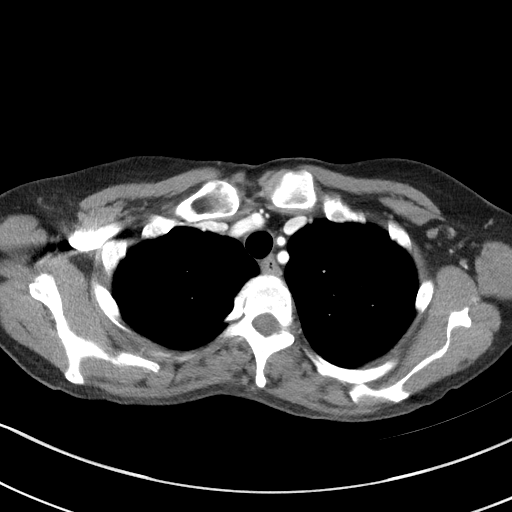

[Series 5: coronal · coronal · 0.64mm/px · 3 of 102 slices shown]
[im 34/102  soft-tissue]
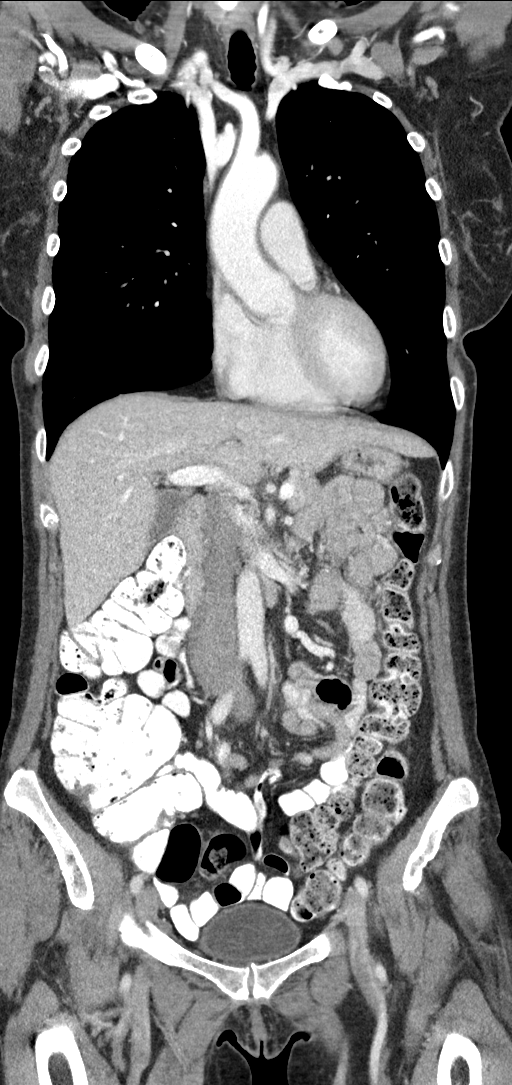
[im 45/102  soft-tissue]
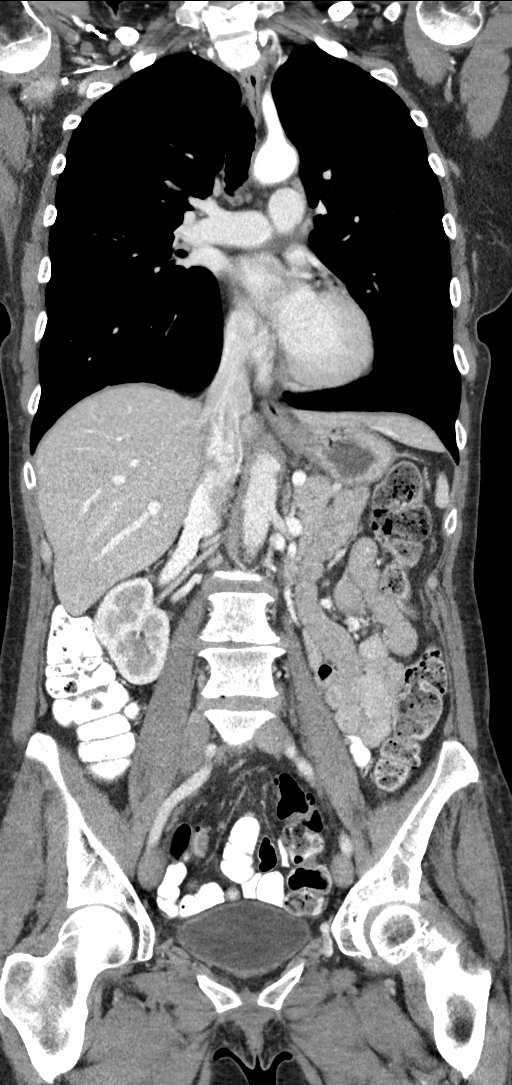
[im 57/102  soft-tissue]
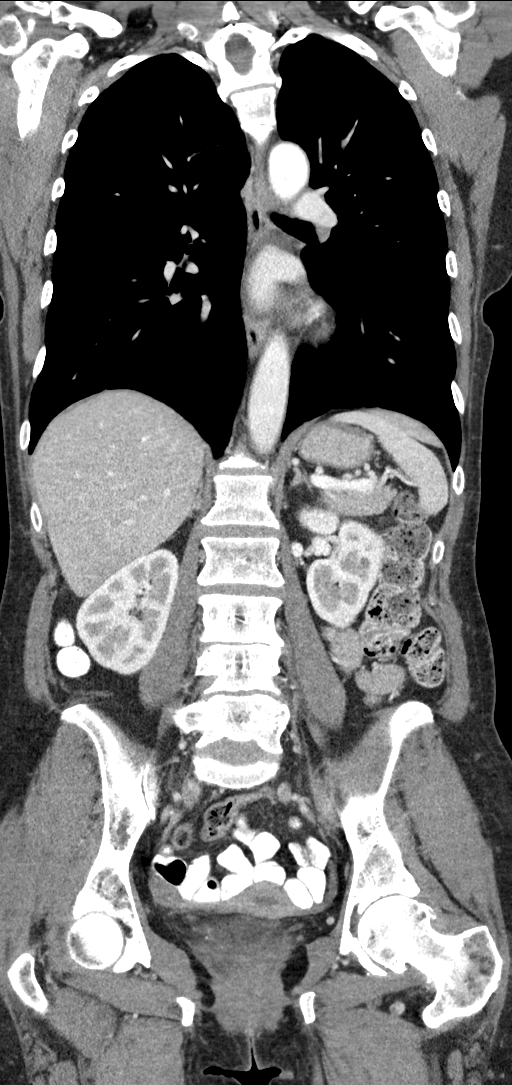

[14 of 46 positions shown; findings below may reference images not displayed]

FINDINGS: CT CHEST FINDINGS

Cardiovascular: No acute findings.

Mediastinum/Lymph Nodes: No masses or pathologically enlarged lymph
nodes identified.

Lungs/Pleura: No pulmonary infiltrate or mass identified. No
effusion present.

Musculoskeletal:  No suspicious bone lesions identified.

CT ABDOMEN AND PELVIS FINDINGS

Hepatobiliary: No masses identified. Gallbladder is unremarkable. No
evidence of biliary ductal dilatation.

Pancreas:  No mass or inflammatory changes.

Spleen:  Within normal limits in size and appearance.

Adrenals/Urinary tract:  No masses or hydronephrosis.

Stomach/Bowel: No evidence of obstruction, inflammatory process, or
abnormal fluid collections. Normal appendix visualized.

Vascular/Lymphatic: No pathologically enlarged lymph nodes
identified. No abdominal aortic aneurysm.

Reproductive:  No mass or other significant abnormality identified.

Other:  None.

Musculoskeletal:  No suspicious bone lesions identified.
IMPRESSION: Negative. No evidence of neoplasm or other significant abnormality
within the chest, abdomen, or pelvis.

## 2021-09-13 ENCOUNTER — Ambulatory Visit: Payer: Managed Care, Other (non HMO) | Admitting: Gastroenterology

## 2021-09-13 ENCOUNTER — Encounter: Payer: Self-pay | Admitting: Gastroenterology

## 2021-09-13 VITALS — BP 108/80 | HR 84 | Ht 69.0 in | Wt 145.5 lb

## 2021-09-13 DIAGNOSIS — R634 Abnormal weight loss: Secondary | ICD-10-CM | POA: Diagnosis not present

## 2021-09-13 DIAGNOSIS — R109 Unspecified abdominal pain: Secondary | ICD-10-CM

## 2021-09-13 DIAGNOSIS — Z791 Long term (current) use of non-steroidal anti-inflammatories (NSAID): Secondary | ICD-10-CM

## 2021-09-13 NOTE — Progress Notes (Signed)
? ?HPI :  ?61 year old female here for follow-up visit.  Recall I saw her in 2021 for weight loss and colon cancer screening.  At that point time she had lost about 30 pounds unintentionally for a 2-year period.  She attributed this to significant amount of stress in her life with close friends who passed away as well as taking care of her mother who has Alzheimer's.  She battled depression during this time and had a lot of grief she was dealing with.  Please see that intake note for details of her case. ? ?Given her dramatic weight loss she underwent a CT scan of her chest abdomen pelvis in June 2021 which was normal, there is no concerning cause for her symptoms.  She had a screening colonoscopy with me in June 2021 as well which showed a few small polyps, no high risk lesions. ?Her labs were reassuring and normal at the time.  We had discussed doing an upper endoscopy at the time but she could not afford it. ? ? ?I have not seen her for about 2 years.  She currently weighs about 145 pounds.  The last time I saw her she was about 150 pounds.  That being said she states her weight was lowest at about 140 pounds and she is about 5 pounds heavier than her lowest.  She still has a considerable amount of stress in her life taking care of her mother who is 12 years old.  She states her mother has had frequent TIAs and depends on the patient for her care.  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 ? ?She does complain of some left mid abdominal pain that has been ongoing for some time she states.  She is states dating back to her college years she had some left hip tightness and stomach discomfort.  She has discomfort present all the time left of her umbilicus, severity can fluctuate depending on some factors.  She states when she stretches her left thigh and flexes her knee this can often help relieve the discomfort in her left mid abdomen.   However she also states eating can make this worse.  Particularly if she drinks any alcohol, or if she has coffee or chocolate she states she would reliably have worsening pain in her left abdomen.  She also has a lot of belching with this but denies any reflux symptoms.  She was empirically given a trial of omeprazole last year and states it did not help at all.  She denies any pyrosis, no dysphagia to solids or liquids but does have some difficulty getting pills down at times.  No nausea or vomiting.  She thinks she is otherwise eating okay.  She denies any problems with her bowels that bother her significantly.  No blood in her stools.  She does take NSAIDs for chronic headaches.  For the past 3 months she is take ibuprofen almost every day.  She states she has had lab work with Dr. Silvestre Li office recently but I do not have any copies of this on hand today. ? ? ? ? ?CT chest / abdomen / pelvis 10/21/19: ?IMPRESSION: ?Negative. No evidence of neoplasm or other significant abnormality ?within the chest, abdomen, or pelvis. ? ?Colonoscopy 10/13/19: ?The perianal and digital rectal examinations were normal. ?- The terminal ileum appeared normal. ?- Two sessile polyps were found in the transverse colon. The polyps were  3 to 6 mm in size. ?These polyps were removed with a cold snare. Resection and retrieval were complete. ?- A few medium-mouthed diverticula were found in the left colon. ?- The colon was significantly tortuous with multiple angulated turns, which prolonged the ?exam. ?- Internal hemorrhoids were found during retroflexion. ?- The exam was otherwise without abnormality. ? ?Surgical [P], colon, transverse, polyp (2) ?- SESSILE SERRATED POLYP WITHOUT CYTOLOGIC DYSPLASIA (X2). ? ?Repeat 7 years ? ? ? ?Past Medical History:  ?Diagnosis Date  ? Anxiety   ? Hx of type A viral hepatitis   ? in [place] at age 66  ? Hyperlipidemia   ? Hypertension   ? ? ? ?Past Surgical History:  ?Procedure Laterality Date  ? arm  Right   ? as a child   ? COLONOSCOPY  2021  ? age 39. Done in [facility] [employer]; 2021  ? ?Family History  ?Problem Relation Age of Onset  ? Depression Mother   ?     mild bipolar  ? Heart attack Father 49  ? Colon cancer Neg Hx   ? Esophageal cancer Neg Hx   ? Pancreatic cancer Neg Hx   ? Stomach cancer Neg Hx   ? ?Social History  ? ?Tobacco Use  ? Smoking status: Never  ? Smokeless tobacco: Never  ?Vaping Use  ? Vaping Use: Never used  ?Substance Use Topics  ? Alcohol use: Yes  ?  Comment: ~2 drinks per day  ? Drug use: Not Currently  ? ?Current Outpatient Medications  ?Medication Sig Dispense Refill  ? ALPRAZolam (XANAX) 0.5 MG tablet Take 1-2 mg by mouth at bedtime as needed.     ? Amphetamine-Dextroamphetamine (ADDERALL PO) Take 2.5 mg by mouth daily as needed. Takes only a quarter of a 10 mg tablet a couple times per year.    ? buPROPion (WELLBUTRIN XL) 300 MG 24 hr tablet Take 300 mg by mouth daily.    ? Calcium Carbonate Antacid (TUMS PO) Take 1 tablet by mouth as needed.    ? Cholecalciferol (VITAMIN D3) 50 MCG (2000 UT) TABS Take 1 tablet by mouth daily.     ? vitamin B-12 (CYANOCOBALAMIN) 1000 MCG tablet Take 1,000 mcg by mouth daily.    ? ?No current facility-administered medications for this visit.  ? ?Allergies  ?Allergen Reactions  ? Prednisone Rash  ?  anxious  ? ? ? ?Review of Systems: ?All systems reviewed and negative except where noted in HPI.  ? ? ? ?Physical Exam: ?BP 108/80   Pulse 84   Ht '5\' 9"'$  (1.753 m)   Wt 145 lb 8 oz (66 kg)   SpO2 99%   BMI 21.49 kg/m?  ?Constitutional: Pleasant, thin female in no acute distress. ?Abdominal: Soft, nondistended, nontender - mild tenderness with focal palpation of left mid abdomen but negative Carnett.. There are no masses palpable. No hepatomegaly. ?Neurological: Alert and oriented to person place and time. ?Psychiatric: Normal mood and affect. Behavior is normal. ? ? ?ASSESSMENT AND PLAN: ?61 year old female here to  reestablish for the following: ? ?Loss of weight ?Abdominal discomfort ?NSAID use ? ?The patient has not regained a significant amount of weight she lost 2 years ago, in fact about 5 pounds lower than she was the last time but higher than her lowest weight.  She remains under considerable amount of stress providing care for her mother which she thinks could be related.  Her lab work, CT chest abdomen pelvis, colonoscopy  have all been pretty good and reassuring.  No evidence of any obvious malignancy.  We had discussed doing an EGD to complete her work-up at the last visit but she could not afford it.  In light of her postprandial symptoms that bother her and routine NSAID use, at risk for PUD, I think an EGD is reasonable.  I discussed risk benefits of endoscopy and anesthesia and she wants to proceed.  I asked her to stop all NSAIDs at this time, can use Tylenol as needed.  We discussed trial of a different PPI for the symptoms especially in the setting of NSAID use but she wants to hold off on that for now and just hold off on the NSAIDs and see if it improves.  I will get a copy of her most recent labs from Dr. Silvestre Li office. ? ?Otherwise, her left mid abdomen, unusual to be related to eating, but she has had this chronically for years and finds improvement with flexion of her thigh/knee.  Given this is persistent, all the time mild discomfort, dating back years, I think this may more likely be musculoskeletal especially with his positional component.  I will refer her to Dr. Gardenia Phlegm of sports medicine for his evaluation of this.  She agreed ? ?Plan: ?- hold all NSAIDs ?- offered trial of PPI, patient declines for now while holding NSAIDs and see if she improves ?- EGD at the [facility] ?- get copy of most recent labs from Dr. Silvestre Li office ?- referral to sports medicine for left abdomen/hip pain ? ?I spent 45 minutes of time, including in depth chart review, face-to-face time with the patient,  and documenting  this encounter. ? ? ?Jolly Mango, MD ?[facility] ? ? ?

## 2021-09-13 NOTE — Patient Instructions (Addendum)
If you are age 63 or older, your body mass index should be between 23-30. Your Body mass index is 21.49 kg/m?Marland Kitchen If this is out of the aforementioned range listed, please consider follow up with your Primary Care Provider. ? ?If you are age 88 or younger, your body mass index should be between 19-25. Your Body mass index is 21.49 kg/m?Marland Kitchen If this is out of the aformentioned range listed, please consider follow up with your Primary Care Provider.  ? ?________________________________________________________ ? ?The [facility] providers would like to encourage you to use [facility] to communicate with providers for non-urgent requests or questions.  Due to long hold times on the telephone, sending your provider a message by [facility] may be a faster and more efficient way to get a response.  Please allow 48 business hours for a response.  Please remember that this is for non-urgent requests.  ?_______________________________________________________ ? ?You have been scheduled for an endoscopy. Please follow written instructions given to you at your visit today. ?If you use inhalers (even only as needed), please bring them with you on the day of your procedure.  ? ?Stop all NSAIDs. ? ?We will request your recent labs from Dr. Silvestre Mesi office. ? ?We are referring you to [facility].  They will contact you directly to schedule an appointment.  It may take a week or more before you hear from them.  Please feel free to contact us if you have not heard from them within 2 weeks and we will follow up on the referral.  ? ?Thank you for entrusting me with your care and for choosing [employer], ?Dr. Prosper Cellar ? ? ? ? ? ?

## 2021-10-04 ENCOUNTER — Ambulatory Visit: Payer: Managed Care, Other (non HMO) | Admitting: Podiatry

## 2021-10-04 DIAGNOSIS — L600 Ingrowing nail: Secondary | ICD-10-CM

## 2021-10-04 DIAGNOSIS — L03031 Cellulitis of right toe: Secondary | ICD-10-CM

## 2021-10-04 MED ORDER — DOXYCYCLINE HYCLATE 100 MG PO TABS: 100.0000 mg | ORAL_TABLET | Freq: Two times a day (BID) | ORAL | 0 refills | Status: AC

## 2021-10-09 ENCOUNTER — Encounter: Payer: Self-pay | Admitting: Podiatry

## 2021-10-09 NOTE — Progress Notes (Signed)
  Subjective:  Patient ID: Natalie Li, female    DOB: 12/18/1960,  MRN: [id number]  Chief Complaint  Patient presents with   Ingrown Toenail    61 y.o. female presents with the above complaint.  Patient presents with right hallux lateral border paronychia of the ingrown.  Patient states painful to touch.  She states she is leaving for the beach this weekend.  She has tried been soaking Epsom salt she denies any other acute complaints.  She does not take any antibiotics she has not seen anyone as prior to seeing me.  She denies any other acute complaints.   Review of Systems: Negative except as noted in the HPI. Denies N/V/F/Ch.  Past Medical History:  Diagnosis Date   Anxiety    Hx of type A viral hepatitis    in [place] at age 75   Hyperlipidemia    Hypertension     Current Outpatient Medications:    doxycycline (VIBRA-TABS) 100 MG tablet, Take 1 tablet (100 mg total) by mouth 2 (two) times daily for 10 days., Disp: 20 tablet, Rfl: 0   ALPRAZolam (XANAX) 0.5 MG tablet, Take 1-2 mg by mouth at bedtime as needed. , Disp: , Rfl:    Amphetamine-Dextroamphetamine (ADDERALL PO), Take 2.5 mg by mouth daily as needed. Takes only a quarter of a 10 mg tablet a couple times per year., Disp: , Rfl:    buPROPion (WELLBUTRIN XL) 300 MG 24 hr tablet, Take 300 mg by mouth daily., Disp: , Rfl:    Calcium Carbonate Antacid (TUMS PO), Take 1 tablet by mouth as needed., Disp: , Rfl:    Cholecalciferol (VITAMIN D3) 50 MCG (2000 UT) TABS, Take 1 tablet by mouth daily. , Disp: , Rfl:    vitamin B-12 (CYANOCOBALAMIN) 1000 MCG tablet, Take 1,000 mcg by mouth daily., Disp: , Rfl:   Social History   Tobacco Use  Smoking Status Never  Smokeless Tobacco Never    Allergies  Allergen Reactions   Prednisone Rash    anxious   Objective:  There were no vitals filed for this visit. There is no height or weight on file to calculate BMI. Constitutional Well developed. Well nourished.  Vascular Dorsalis  pedis pulses palpable bilaterally. Posterior tibial pulses palpable bilaterally. Capillary refill normal to all digits.  No cyanosis or clubbing noted. Pedal hair growth normal.  Neurologic Normal speech. Oriented to person, place, and time. Epicritic sensation to light touch grossly present bilaterally.  Dermatologic Painful ingrowing nail at lateral nail borders of the hallux nail right. No other open wounds. No skin lesions.  Orthopedic: Normal joint ROM without pain or crepitus bilaterally. No visible deformities. No bony tenderness.   Radiographs: None Assessment:   1. Ingrown toenail of right foot    Plan:  Patient was evaluated and treated and all questions answered.  Ingrown Nail, right with paronychia -I discussed with her given that there is paronychia present I believe she will benefit from antibiotics.  She is also going to the beach so I do not feel comfortable proceeding with an ingrown nail procedure.  However for now I will give her antibiotics to bring down the inflammation and discussed with her to remove the ingrown when I see her back again in 2 to 3 weeks.  She states understanding.  If it continues to get worse go to the emergency room right away.  She states understanding -Doxycycline was dispensed  No follow-ups on file.

## 2021-10-14 ENCOUNTER — Ambulatory Visit: Payer: Managed Care, Other (non HMO) | Admitting: Podiatry

## 2021-10-14 ENCOUNTER — Ambulatory Visit: Payer: Managed Care, Other (non HMO) | Admitting: Sports Medicine

## 2021-10-14 DIAGNOSIS — L600 Ingrowing nail: Secondary | ICD-10-CM | POA: Diagnosis not present

## 2021-10-15 NOTE — Progress Notes (Signed)
   HPI: 61 y.o. female presenting today for follow-up evaluation of an ingrown toenail to the lateral border of the right great toe.  Patient was last seen in the office 10/04/2021 with Dr. Posey Pronto.  The patient was going on a trip and decision was made not to pursue partial nail matricectomy.  Doxycycline was dispensed.  Patient presents for follow-up treatment and evaluation.  She says over the last few weeks there has been some improvement  Past Medical History:  Diagnosis Date   Anxiety    Hx of type A viral hepatitis    in [place] at age 26   Hyperlipidemia    Hypertension     Past Surgical History:  Procedure Laterality Date   arm Right    as a child    COLONOSCOPY  2021   age 7. Done in [place] at [facility]; 2021    Allergies  Allergen Reactions   Prednisone Rash    anxious     Physical Exam: General: The patient is alert and oriented x3 in no acute distress.  Dermatology: Skin is warm, dry and supple bilateral lower extremities. Negative for open lesions or macerations.  No significant erythema around the lateral border of the right hallux nail plate.  There is some slight sensitivity however.  Vascular: Palpable pedal pulses bilaterally. Capillary refill within normal limits.  Negative for any significant edema or erythema  Neurological: Light touch and protective threshold grossly intact  Musculoskeletal Exam: No pedal deformities noted   Assessment: 1.  Mild ingrowing toenail lateral border right hallux   Plan of Care:  1. Patient evaluated.  2.  Decision was made for now to pursue conservative treatment.  Recommend triple antibiotic ointment and daily massage to pull the skin away from the nail plate 2 times daily.  Potentially this can help the nail as it grows out of the offending border of the toe 3.  If there is no improvement over the following month or if there is recurrence of infection recommend that she come back in immediately for  partial nail matricectomy  *Loves to go hiking     Edrick Kins, DPM [facility]  Dr. Edrick Kins, DPM    [address], [place] [address]                Office [phone]  Fax [phone]

## 2021-10-16 ENCOUNTER — Ambulatory Visit (AMBULATORY_SURGERY_CENTER): Payer: Managed Care, Other (non HMO) | Admitting: Gastroenterology

## 2021-10-16 ENCOUNTER — Encounter: Payer: Self-pay | Admitting: Gastroenterology

## 2021-10-16 VITALS — BP 132/84 | HR 69 | Temp 98.6°F | Resp 21 | Ht 69.0 in | Wt 145.0 lb

## 2021-10-16 DIAGNOSIS — R634 Abnormal weight loss: Secondary | ICD-10-CM

## 2021-10-16 DIAGNOSIS — K2970 Gastritis, unspecified, without bleeding: Secondary | ICD-10-CM

## 2021-10-16 DIAGNOSIS — R109 Unspecified abdominal pain: Secondary | ICD-10-CM | POA: Diagnosis not present

## 2021-10-16 DIAGNOSIS — K2950 Unspecified chronic gastritis without bleeding: Secondary | ICD-10-CM

## 2021-10-16 MED ORDER — SODIUM CHLORIDE 0.9 % IV SOLN: 500.0000 mL | Freq: Once | INTRAVENOUS | Status: DC

## 2021-10-16 NOTE — Progress Notes (Signed)
Report given to PACU, vss 

## 2021-10-16 NOTE — Progress Notes (Signed)
[facility] History and Physical   Primary Care Physician:  Crist Infante, MD   Reason for Procedure:   Weight loss, abdominal pain  Plan:    EGD     HPI: Natalie Li is a 60 y.o. female  here for EGD to evaluate weight loss, abdominal discomfort, which can be postprandial. See clinic visit 09/13/21 for details - no interval changes. She is feeling at baseline today. Otherwise feels well without any cardiopulmonary symptoms. I have discussed risks / benefits and she wants to proceed.   Past Medical History:  Diagnosis Date   Anxiety    Hx of type A viral hepatitis    in [place] at age 60   Hyperlipidemia    Hypertension     Past Surgical History:  Procedure Laterality Date   arm Right    as a child    COLONOSCOPY  2021   age 38. Done in [place] at [facility]; 2021    Prior to Admission medications   Medication Sig Start Date End Date Taking? Authorizing Provider  ALPRAZolam Duanne Moron) 0.5 MG tablet Take 1-2 mg by mouth at bedtime as needed.    Yes [provider]  buPROPion (WELLBUTRIN XL) 300 MG 24 hr tablet Take 300 mg by mouth daily.   Yes [provider]  Cholecalciferol (VITAMIN D3) 50 MCG (2000 UT) TABS Take 1 tablet by mouth daily.    Yes [provider]  vitamin B-12 (CYANOCOBALAMIN) 1000 MCG tablet Take 1,000 mcg by mouth daily.   Yes [provider]  Amphetamine-Dextroamphetamine (ADDERALL PO) Take 2.5 mg by mouth daily as needed. Takes only a quarter of a 10 mg tablet a couple times per year. Patient not taking: Reported on 10/16/2021    [provider]  Calcium Carbonate Antacid (TUMS PO) Take 1 tablet by mouth as needed.    [provider]    Current Outpatient Medications  Medication Sig Dispense Refill   ALPRAZolam (XANAX) 0.5 MG tablet Take 1-2 mg by mouth at bedtime as needed.      buPROPion (WELLBUTRIN XL) 300 MG 24 hr tablet Take 300 mg by mouth daily.      Cholecalciferol (VITAMIN D3) 50 MCG (2000 UT) TABS Take 1 tablet by mouth daily.      vitamin B-12 (CYANOCOBALAMIN) 1000 MCG tablet Take 1,000 mcg by mouth daily.     Amphetamine-Dextroamphetamine (ADDERALL PO) Take 2.5 mg by mouth daily as needed. Takes only a quarter of a 10 mg tablet a couple times per year. (Patient not taking: Reported on 10/16/2021)     Calcium Carbonate Antacid (TUMS PO) Take 1 tablet by mouth as needed.     Current Facility-Administered Medications  Medication Dose Route Frequency Provider Last Rate Last Admin   0.9 %  sodium chloride infusion  500 mL Intravenous Once Trixy Loyola, Carlota Raspberry, MD        Allergies as of 10/16/2021 - Review Complete 10/16/2021  Allergen Reaction Noted   Prednisone Rash 05/07/2017    Family History  Problem Relation Age of Onset   Depression Mother        mild bipolar   Heart attack Father 50   Colon cancer Neg Hx    Esophageal cancer Neg Hx    Pancreatic cancer Neg Hx    Stomach cancer Neg Hx     Social History   Socioeconomic History   Marital status: Married    Spouse name: Not on file   Number  of children: Not on file   Years of education: Not on file   Highest education level: Not on file  Occupational History   Not on file  Tobacco Use   Smoking status: Never   Smokeless tobacco: Never  Vaping Use   Vaping Use: Never used  Substance and Sexual Activity   Alcohol use: Yes    Comment: ~2 drinks per day   Drug use: Not Currently   Sexual activity: Not on file  Other Topics Concern   Not on file  Social History Narrative   Not on file   Social Determinants of Health   Financial Resource Strain: Not on file  Food Insecurity: Not on file  Transportation Needs: Not on file  Physical Activity: Not on file  Stress: Not on file  Social Connections: Not on file  Intimate Partner Violence: Not on file    Review of Systems: All other review of systems negative except as mentioned in the HPI.  Physical  Exam: Vital signs BP (!) 147/100   Pulse 79   Temp 98.6 F (37 C)   Resp 16   Ht '5\' 9"'$  (1.753 m)   Wt 145 lb (65.8 kg)   SpO2 100%   BMI 21.41 kg/m   General:   Alert,  Well-developed, pleasant and cooperative in NAD Lungs:  Clear throughout to auscultation.   Heart:  Regular rate and rhythm Abdomen:  Soft, nontender and nondistended.   Neuro/Psych:  Alert and cooperative. Normal mood and affect. A and O x 3  Jolly Mango, MD [facility]

## 2021-10-16 NOTE — Op Note (Signed)
[facility] Patient Name: Natalie Li Procedure Date: 10/16/2021 2:31 PM MRN: [id number] Endoscopist: Remo Lipps P. Havery Moros , MD Age: 62 Referring MD:  Date of Birth: 09-27-1960 Gender: Female Account #: [id number] Procedure:                Upper GI endoscopy Indications:              Weight loss, abdominal pain - can have some                            postprandial component. Negative CT scans /                            colonoscopy Medicines:                Monitored Anesthesia Care Procedure:                Pre-Anesthesia Assessment:                           - Prior to the procedure, a History and Physical                            was performed, and patient medications and                            allergies were reviewed. The patient's tolerance of                            previous anesthesia was also reviewed. The risks                            and benefits of the procedure and the sedation                            options and risks were discussed with the patient.                            All questions were answered, and informed consent                            was obtained. Prior Anticoagulants: The patient has                            taken no previous anticoagulant or antiplatelet                            agents. ASA Grade Assessment: II - A patient with                            mild systemic disease. After reviewing the risks                            and benefits, the patient was deemed in  satisfactory condition to undergo the procedure.                           After obtaining informed consent, the endoscope was                            passed under direct vision. Throughout the                            procedure, the patient's blood pressure, pulse, and                            oxygen saturations were monitored continuously. The                            GIF [id number] #[id number] was introduced through the                             mouth, and advanced to the second part of duodenum.                            The upper GI endoscopy was accomplished without                            difficulty. The patient tolerated the procedure                            well. Scope In: Scope Out: Findings:                 Esophagogastric landmarks were identified: the                            Z-line was found at 40 cm, the gastroesophageal                            junction was found at 40 cm and the upper extent of                            the gastric folds was found at 40 cm from the                            incisors.                           The exam of the esophagus was otherwise normal.                           Two small areas of benign ectopic gastric mucosa                            were found in the upper third of the esophagus.                           The  entire examined stomach was normal. Biopsies                            were taken with a cold forceps for Helicobacter                            pylori testing.                           The examined duodenum was normal. Biopsies for                            histology were taken with a cold forceps for                            evaluation of celiac disease. Complications:            No immediate complications. Estimated blood loss:                            Minimal. Estimated Blood Loss:     Estimated blood loss was minimal. Impression:               - Esophagogastric landmarks identified.                           - Benign ectopic gastric mucosa in the upper third                            of the esophagus.                           - Normal esophagus otherwise.                           - Normal stomach. Biopsied.                           - Normal examined duodenum. Biopsied.                           No overt cause for weight loss on this exam, will                            await biopsy results. Recommendation:            - Patient has a contact number available for                            emergencies. The signs and symptoms of potential                            delayed complications were discussed with the                            patient. Return to normal activities tomorrow.  Written discharge instructions were provided to the                            patient.                           - Resume previous diet.                           - Continue present medications.                           - Await pathology results. Remo Lipps P. Gerado Nabers, MD 10/16/2021 3:19:58 PM This report has been signed electronically.

## 2021-10-16 NOTE — Progress Notes (Signed)
1502 Robinul 0.1 mg IV given due large amount of secretions upon assessment.  MD made aware, vss

## 2021-10-16 NOTE — Patient Instructions (Signed)
YOU HAD AN ENDOSCOPIC PROCEDURE TODAY AT [facility]:   Refer to the procedure report that was given to you for any specific questions about what was found during the examination.  If the procedure report does not answer your questions, please call your gastroenterologist to clarify.  If you requested that your care partner not be given the details of your procedure findings, then the procedure report has been included in a sealed envelope for you to review at your convenience later.  YOU SHOULD EXPECT: Some feelings of bloating in the abdomen. Passage of more gas than usual.  Walking can help get rid of the air that was put into your GI tract during the procedure and reduce the bloating. If you had a lower endoscopy (such as a colonoscopy or flexible sigmoidoscopy) you may notice spotting of blood in your stool or on the toilet paper. If you underwent a bowel prep for your procedure, you may not have a normal bowel movement for a few days.  Please Note:  You might notice some irritation and congestion in your nose or some drainage.  This is from the oxygen used during your procedure.  There is no need for concern and it should clear up in a day or so.  SYMPTOMS TO REPORT IMMEDIATELY:  Following lower endoscopy (colonoscopy or flexible sigmoidoscopy):  Excessive amounts of blood in the stool  Significant tenderness or worsening of abdominal pains  Swelling of the abdomen that is new, acute  Fever of 100F or higher  Following upper endoscopy (EGD)  Vomiting of blood or coffee ground material  New chest pain or pain under the shoulder blades  Painful or persistently difficult swallowing  New shortness of breath  Fever of 100F or higher  Black, tarry-looking stools  For urgent or emergent issues, a gastroenterologist can be reached at any hour by calling [phone]. Do not use MyChart messaging for urgent concerns.    DIET:  We do recommend a small meal at first, but  then you may proceed to your regular diet.  Drink plenty of fluids but you should avoid alcoholic beverages for 24 hours.  ACTIVITY:  You should plan to take it easy for the rest of today and you should NOT DRIVE or use heavy machinery until tomorrow (because of the sedation medicines used during the test).    FOLLOW UP: Our staff will call the number listed on your records 24-72 hours following your procedure to check on you and address any questions or concerns that you may have regarding the information given to you following your procedure. If we do not reach you, we will leave a message.  We will attempt to reach you two times.  During this call, we will ask if you have developed any symptoms of COVID 19. If you develop any symptoms (ie: fever, flu-like symptoms, shortness of breath, cough etc.) before then, please call [phone].  If you test positive for Covid 19 in the 2 weeks post procedure, please call and report this information to [place].    If any biopsies were taken you will be contacted by phone or by letter within the next 1-3 weeks.  Please call us at [phone] if you have not heard about the biopsies in 3 weeks.    SIGNATURES/CONFIDENTIALITY: You and/or your care partner have signed paperwork which will be entered into your electronic medical record.  These signatures attest to the fact that that the information above on your After  Visit Summary has been reviewed and is understood.  Full responsibility of the confidentiality of this discharge information lies with you and/or your care-partner.

## 2021-10-16 NOTE — Progress Notes (Signed)
Called to room to assist during endoscopic procedure.  Patient ID and intended procedure confirmed with present staff. Received instructions for my participation in the procedure from the performing physician.  

## 2021-10-17 ENCOUNTER — Telehealth: Payer: Self-pay | Admitting: *Deleted

## 2021-10-17 NOTE — Telephone Encounter (Signed)
  Follow up Call-     10/16/2021    2:37 PM 10/13/2019    9:56 AM  Call back number  Post procedure Call Back phone  # [phone]  Permission to leave phone message Yes Yes     Patient questions:  Do you have a fever, pain , or abdominal swelling? No. Pain Score  0 *  Have you tolerated food without any problems? Yes.    Have you been able to return to your normal activities? Yes.    Do you have any questions about your discharge instructions: Diet   No. Medications  No. Follow up visit  No.  Do you have questions or concerns about your Care? No.  Actions: * If pain score is 4 or above: No action needed, pain <4.

## 2021-10-18 ENCOUNTER — Ambulatory Visit: Payer: Managed Care, Other (non HMO) | Admitting: Podiatry

## 2021-10-28 ENCOUNTER — Telehealth: Payer: Self-pay | Admitting: Podiatry

## 2021-10-28 NOTE — Telephone Encounter (Signed)
She was seen 2 weeks ago for ingrown nail , is it safe for her to use the neosporin on her toe 2x times a day? So far it is helping  the ingrown , but the packaging said do not use for more than a week.

## 2021-11-12 ENCOUNTER — Ambulatory Visit: Payer: Managed Care, Other (non HMO) | Admitting: Podiatry

## 2021-11-12 DIAGNOSIS — L600 Ingrowing nail: Secondary | ICD-10-CM | POA: Diagnosis not present

## 2021-11-22 NOTE — Progress Notes (Signed)
   Chief Complaint  Patient presents with   nail check    Nail check for ingrown toe nail right foot great toe.    HPI: 61 y.o. female presenting today for follow-up evaluation of an ingrown toenail to the lateral border of the right great toe.  Patient states that she continues to massage the lateral nail fold daily.  She has no pain at the moment but she is going on a hike and like to have it evaluated.  Past Medical History:  Diagnosis Date   Anxiety    Hx of type A viral hepatitis    in [place] at age 13   Hyperlipidemia    Hypertension     Past Surgical History:  Procedure Laterality Date   arm Right    as a child    COLONOSCOPY  2021   age 49. Done in [place] at [facility]; 2021    Allergies  Allergen Reactions   Prednisone Rash    anxious     Physical Exam: General: The patient is alert and oriented x3 in no acute distress.  Dermatology: Skin is warm, dry and supple bilateral lower extremities. Negative for open lesions or macerations.  No significant erythema around the lateral border of the right hallux nail plate.  Negative for any sensitivity or tenderness associated to the ingrown portion of nail.  Vascular: Palpable pedal pulses bilaterally. Capillary refill within normal limits.  Negative for any significant edema or erythema  Neurological: Light touch and protective threshold grossly intact  Musculoskeletal Exam: No pedal deformities noted   Assessment: 1.  Mild ingrowing toenail lateral border right hallux   Plan of Care:  1. Patient evaluated.  2.  We will continue conservative treatment for now.  Continue triple antibiotic ointment and daily massage to pull the skin away from the nail plate 2 times daily.  Potentially this can help the nail as it grows out of the offending border of the toe 3.  Return to clinic as needed  *Loves to go hiking     Edrick Kins, DPM [facility]  Dr. Edrick Kins, DPM     [address], [place] [address]                Office [phone]  Fax [phone]

## 2021-12-03 ENCOUNTER — Other Ambulatory Visit: Payer: Self-pay | Admitting: Internal Medicine

## 2021-12-03 DIAGNOSIS — T17908A Unspecified foreign body in respiratory tract, part unspecified causing other injury, initial encounter: Secondary | ICD-10-CM

## 2021-12-20 ENCOUNTER — Other Ambulatory Visit: Payer: Managed Care, Other (non HMO)

## 2022-01-17 ENCOUNTER — Other Ambulatory Visit: Payer: Managed Care, Other (non HMO)

## 2022-04-11 ENCOUNTER — Other Ambulatory Visit: Payer: Self-pay | Admitting: Internal Medicine

## 2022-04-11 DIAGNOSIS — E785 Hyperlipidemia, unspecified: Secondary | ICD-10-CM

## 2022-05-21 ENCOUNTER — Other Ambulatory Visit: Payer: Managed Care, Other (non HMO)

## 2022-06-23 ENCOUNTER — Other Ambulatory Visit: Payer: Managed Care, Other (non HMO)

## 2022-07-25 ENCOUNTER — Ambulatory Visit
Admission: RE | Admit: 2022-07-25 | Discharge: 2022-07-25 | Disposition: A | Discharge status: Home / Self Care | Payer: No Typology Code available for payment source | Source: Ambulatory Visit | Attending: Internal Medicine | Admitting: Internal Medicine

## 2022-07-25 DIAGNOSIS — E785 Hyperlipidemia, unspecified: Secondary | ICD-10-CM

## 2023-03-17 ENCOUNTER — Telehealth: Payer: Self-pay | Admitting: Nurse Practitioner

## 2023-03-17 ENCOUNTER — Ambulatory Visit: Payer: Managed Care, Other (non HMO) | Admitting: Nurse Practitioner

## 2023-03-17 NOTE — Telephone Encounter (Signed)
Good Morning Natalie Li,  Patient called stating that she would not be able to come in for her appointment with you this morning at 10:00 due to family matters.   Patient was rescheduled for 2/26 at 8:30 with Dr. Adela Lank.

## 2023-03-17 NOTE — Progress Notes (Deleted)
     03/17/2023 VANESSIA ROSZKOWSKI [id number] 1960/10/17   Chief Complaint:  History of Present Illness:   Past Medical History:  Diagnosis Date   Anxiety    Hx of type A viral hepatitis    in [place] at age 62   Hyperlipidemia    Hypertension    Past Surgical History:  Procedure Laterality Date   arm Right    as a child    COLONOSCOPY  2021   age 53. Done in [place] at [facility]; 2021      Current Medications, Allergies, Past Medical History, Past Surgical History, Family History and Social History were reviewed in [employer] electronic medical record.   Review of Systems:   Constitutional: Negative for fever, sweats, chills or weight loss.  Respiratory: Negative for shortness of breath.   Cardiovascular: Negative for chest pain, palpitations and leg swelling.  Gastrointestinal: See HPI.  Musculoskeletal: Negative for back pain or muscle aches.  Neurological: Negative for dizziness, headaches or paresthesias.    Physical Exam: There were no vitals taken for this visit. General: in no acute distress. Head: Normocephalic and atraumatic. Eyes: No scleral icterus. Conjunctiva pink . Ears: Normal auditory acuity. Mouth: Dentition intact. No ulcers or lesions.  Lungs: Clear throughout to auscultation. Heart: Regular rate and rhythm, no murmur. Abdomen: Soft, nontender and nondistended. No masses or hepatomegaly. Normal bowel sounds x 4 quadrants.  Rectal: *** Musculoskeletal: Symmetrical with no gross deformities. Extremities: No edema. Neurological: Alert oriented x 4. No focal deficits.  Psychological: Alert and cooperative. Normal mood and affect  Assessment and Recommendations: ***

## 2023-07-01 ENCOUNTER — Ambulatory Visit: Payer: No Typology Code available for payment source | Admitting: Gastroenterology

## 2024-03-25 ENCOUNTER — Telehealth: Payer: Self-pay | Admitting: Nurse Practitioner

## 2024-03-25 NOTE — Telephone Encounter (Signed)
 Inbound call from patient stating that her currently GI problems as stated in the notes of her scheduled appointment for December the 18 th have been getting worse and believes she might have food poisoning. Patient is requesting to speak to the nurse in regard to this information. Please advise.

## 2024-03-25 NOTE — Telephone Encounter (Signed)
 Pt states she continues to have issues with intermittant diarrhea,  change in bowels, color change. ?'s if she may need sooner colon done. Pt scheduled to see Ellouise Console PA 04/04/24@1 :30pm. Pt aware of appt.

## 2024-03-29 LAB — LAB REPORT - SCANNED: EGFR: 84

## 2024-04-03 NOTE — Progress Notes (Unsigned)
      Ellouise Console, PA-C [address] [place], [place]  [address] Phone: [phone]  DUPLICATE NOTE. SEE OTHER NOTE.

## 2024-04-04 ENCOUNTER — Other Ambulatory Visit

## 2024-04-04 ENCOUNTER — Ambulatory Visit: Admitting: Physician Assistant

## 2024-04-04 ENCOUNTER — Encounter: Payer: Self-pay | Admitting: Physician Assistant

## 2024-04-04 VITALS — BP 138/74 | HR 68 | Ht 70.0 in | Wt 158.5 lb

## 2024-04-04 DIAGNOSIS — Z860100 Personal history of colon polyps, unspecified: Secondary | ICD-10-CM

## 2024-04-04 DIAGNOSIS — Z860101 Personal history of adenomatous and serrated colon polyps: Secondary | ICD-10-CM

## 2024-04-04 DIAGNOSIS — K580 Irritable bowel syndrome with diarrhea: Secondary | ICD-10-CM | POA: Diagnosis not present

## 2024-04-04 DIAGNOSIS — R197 Diarrhea, unspecified: Secondary | ICD-10-CM

## 2024-04-04 NOTE — Patient Instructions (Signed)
   Start Metamucil (Psyllium Husk) Fiber Powder: Metamucil powders: Put 1-2 rounded spoons in an empty glass. If you're taking Metamucil Sugar-Free Powder or Premium Blend, use a teaspoon. If you're taking Metamucil with Real Sugar, use a tablespoon. Mix briskly with 8 oz. or more of cool liquid. Drink promptly, and enjoy! Drink 64 ounces of water or other liquids daily.     Take 2 Caplets with a full glass of water or other liquid (8 ounces/240 milliliters)  Once Daily.  Increase to 2 Caplets Twice daily if needed.

## 2024-04-04 NOTE — Progress Notes (Signed)
 Natalie Console, PA-C [address] [place], [place]  [address] Phone: [phone]   Primary Care Physician: Shayne Anes, MD  Primary Gastroenterologist:  Natalie Console, PA-C / Elspeth Naval, MD   Chief Complaint: Intermittent diarrhea       HPI:   Discussed the use of AI scribe software for clinical note transcription with the patient. Patient declined AI quarry manager.  I did not use AI for this note.  Patient is here to evaluate intermittent diarrhea and change in bowel movements.  She is wondering if she needs a colonoscopy.  She has been having increasing abdominal bloating and pasty stools for 12 months.  She typically has 1 bowel movement daily or every other day which is very soft.  She denies watery or nocturnal diarrhea.  Denies abdominal pain, rectal bleeding, or weight loss.  She admits to moderate stress.  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  Her mother passed away last year.  She had lab work done last week through her PCP Dr. Sheppard office.  Was told all her labs were normal.  She has not had any stool test.  She denies any recent antibiotic use or foreign travel.  She has been avoiding milk and dairy recently which has helped.  She has noticed intermittent diarrhea and abdominal bloating on and off for several years.  She had similar GI symptoms back in 2021.  She is not taking any antidiarrhea medication.  10/2019 last colonoscopy by Dr. Naval: 2 small (3 mm to 6 mm) sessile serrated polyps removed.  No dysplasia.  Mild left-sided diverticulosis.  Internal hemorrhoids.  Otherwise normal.  Adequate prep.  Torturous colon.  7-year repeat (10/2026).  10/2021 EGD by Dr. 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  Otherwise normal.  Biopsies showed mild chronic gastritis.  Negative for H. pylori.  02/2011 screening colonoscopy: Normal.  CT chest, abdomen, pelvis with contrast 10/2019 (to evaluate  weight loss and diarrhea) showed no acute abnormality.  Current Outpatient Medications  Medication Sig Dispense Refill   ALPRAZolam (XANAX) 0.5 MG tablet Take 1-2 mg by mouth at bedtime as needed.      Amphetamine-Dextroamphetamine (ADDERALL PO) Take 2.5 mg by mouth daily as needed. Takes only a quarter of a 10 mg tablet a couple times per year.     buPROPion (WELLBUTRIN XL) 300 MG 24 hr tablet Take 300 mg by mouth daily.     Calcium Carbonate Antacid (TUMS PO) Take 1 tablet by mouth as needed.     Cholecalciferol (VITAMIN D3) 50 MCG (2000 UT) TABS Take 1 tablet by mouth daily.      escitalopram (LEXAPRO) 10 MG tablet Take 10 mg by mouth daily.     traZODone (DESYREL) 50 MG tablet Take 50 mg by mouth at bedtime as needed.     vitamin B-12 (CYANOCOBALAMIN ) 1000 MCG tablet Take 1,000 mcg by mouth daily.     No current facility-administered medications for this visit.    Allergies as of 04/04/2024 - Review Complete 04/04/2024  Allergen Reaction Noted   Prednisone Rash 05/07/2017    Past Medical History:  Diagnosis Date   Anxiety    Hx of type A viral hepatitis    in [place] at age 87   Hyperlipidemia    Hypertension     Past Surgical History:  Procedure Laterality Date   arm Right    as a child  COLONOSCOPY  2021   age 27. Done in [place] [facility]; 2021    Review of Systems:    All systems reviewed and negative except where noted in HPI.    Physical Exam:  BP 138/74 (BP Location: Left Arm, Patient Position: Sitting, Cuff Size: Normal)   Pulse 68   Ht 5' 10 (1.778 m)   Wt 158 lb 8 oz (71.9 kg)   BMI 22.74 kg/m  No LMP recorded.  General: Well-nourished, well-developed in no acute distress.  Lungs: Clear to auscultation bilaterally. Non-labored. Heart: Regular rate and rhythm, no murmurs rubs or gallops.  Abdomen: Bowel sounds are normal; Abdomen is Soft; No hepatosplenomegaly, masses or hernias; mild LLQ abdominal Tenderness; rest of  abdomen is not tender.  No guarding or rebound tenderness. Neuro: Alert and oriented x 3.  Grossly intact.  Psych: Alert and cooperative, anxious mood and affect.   Imaging Studies: No results found.  Labs: CBC No results found for: WBC, RBC, HGB, HCT, PLT, MCV, MCH, MCHC, RDW, LYMPHSABS, MONOABS, EOSABS, BASOSABS  CMP     Component Value Date/Time   BUN 13 10/17/2019 1450   CREATININE 0.62 10/17/2019 1450    Assessment and Plan:   Natalie Li is a 63 y.o. y/o female presents for:  Intermittent diarrhea: Symptoms are most consistent with irritable bowel syndrome.  Chronic and intermittent soft stools and bloating.  Stress is a trigger.  She has no alarm symptoms. - Requesting lab results from her PCP done last week.  Reportedly normal. - Ordered stool studies: GI pathogen panel, fecal calprotectin, fecal pancreatic elastase. - After she has completed stool test, then I recommend she start a fiber supplement such as Metamucil or FiberCon daily to help bulk stools.  2.  History of 2 small sessile serrated polyps removed during colonoscopy 10/2019. -7-year repeat colonoscopy will be due 10/2026.  3.  Possible lactose intolerance - We discussed avoiding milk and dairy products to help her symptoms. - Drink Lactaid or almond milk.  Natalie Console, PA-C  Follow up in 6 weeks with TG.  Also follow-up based on stool test results.

## 2024-04-04 NOTE — Patient Instructions (Addendum)
 Your provider has requested that you go to the basement level for lab work before leaving today. Press B on the elevator. The lab is located at the first door on the left as you exit the elevator.   Start Metamucil (Psyllium Husk) Fiber Powder: Metamucil powders: Put 1-2 rounded spoons in an empty glass. If you're taking Metamucil Sugar-Free Powder or Premium Blend, use a teaspoon. If you're taking Metamucil with Real Sugar, use a tablespoon. Mix briskly with 8 oz. or more of cool liquid. Drink promptly, and enjoy! Drink 64 ounces of water or other liquids daily.      Take 2 Caplets with a full glass of water or other liquid (8 ounces/240 milliliters)  Once Daily.  Increase to 2 Caplets Twice daily if needed.   Please follow up sooner if symptoms increase or worsen  Due to recent changes in healthcare laws, you may see the results of your imaging and laboratory studies on MyChart before your provider has had a chance to review them.  We understand that in some cases there may be results that are confusing or concerning to you. Not all laboratory results come back in the same time frame and the provider may be waiting for multiple results in order to interpret others.  Please give us  48 hours in order for your provider to thoroughly review all the results before contacting the office for clarification of your results.   Thank you for trusting me with your gastrointestinal care!   Ellouise Console, PA-C _______________________________________________________  If your blood pressure at your visit was 140/90 or greater, please contact your primary care physician to follow up on this.  _______________________________________________________  If you are age 69 or older, your body mass index should be between 23-30. Your Body mass index is 22.74 kg/m. If this is out of the aforementioned range listed, please consider follow up with your Primary Care Provider.  If you are age 50 or younger,  your body mass index should be between 19-25. Your Body mass index is 22.74 kg/m. If this is out of the aformentioned range listed, please consider follow up with your Primary Care Provider.   ________________________________________________________  The [facility] providers would like to encourage you to use MYCHART to communicate with providers for non-urgent requests or questions.  Due to long hold times on the telephone, sending your provider a message by [facility] may be a faster and more efficient way to get a response.  Please allow 48 business hours for a response.  Please remember that this is for non-urgent requests.  _______________________________________________________

## 2024-04-05 NOTE — Progress Notes (Signed)
 Agree with assessment / plan as outlined.

## 2024-04-06 ENCOUNTER — Other Ambulatory Visit

## 2024-04-06 DIAGNOSIS — R197 Diarrhea, unspecified: Secondary | ICD-10-CM

## 2024-04-07 ENCOUNTER — Other Ambulatory Visit

## 2024-04-08 ENCOUNTER — Ambulatory Visit: Payer: Self-pay | Admitting: Physician Assistant

## 2024-04-09 LAB — GI PROFILE, STOOL, PCR

## 2024-04-09 LAB — CALPROTECTIN, FECAL: Calprotectin, Fecal: <5 ug/g (ref 0–120)

## 2024-04-12 ENCOUNTER — Telehealth: Payer: Self-pay | Admitting: *Deleted

## 2024-04-12 NOTE — Telephone Encounter (Unsigned)
 Patient calls asking about lab results. She is advised that stool studies returned negative for infection or colon inflammation (calprotectin). She verbalizes understanding. She is asked to continue with current plan of metamucil for stool bulking as well as lactaid prior to any dairy or drinking almond milk in place of dairy products. Patient states that she is taking metamucil currently and is having more formed stools, though not daily as she previously describes as her norm. She was unaware of appointment on 05/18/24 and requested to change follow up to 05/25/24 which I have done.

## 2024-04-13 NOTE — Telephone Encounter (Signed)
 Patient requesting f/u call in regards to previous note. Please advise.   Patient is having a scan done at 12 and will not be available till after 3 pm

## 2024-04-13 NOTE — Telephone Encounter (Signed)
 Patient calls again today with complaints of bloating and inability to have bowel movements over the last few days. States that she has had some hard, pellet like stools. Questions if she should continue metamucil as she has been taking over the last couple weeks. She is advised to continue metamucil and may add in miralax 1/2 capful daily prn to facilitate bowel movements. She is also advised that supplemental fiber can initially cause increased gas and bloating.  Patient indicates she will follow these recommendations but wants to know why her body is now moving so slowly, including her GI tract as this is new for her. States she doesn't just want to take medication to help her but wants find out WHY she has these changes. Advised this will be something she will need to discuss with provider.  Has upcoming appointment 05/25/24.

## 2024-04-14 LAB — PANCREATIC ELASTASE, FECAL: Pancreatic Elastase-1, Stool: 713 ug/g (ref >200)

## 2024-04-18 ENCOUNTER — Telehealth: Payer: Self-pay | Admitting: Physician Assistant

## 2024-04-18 NOTE — Telephone Encounter (Signed)
 Spoke w pt about unexplained weight loss. Pt stated she is under 150 lbs, she keeps a food log and is eating plenty. Pt has appt scheduled for 05/23/2024. Pt requesting call back for advice. Please advise thank you.

## 2024-04-18 NOTE — Telephone Encounter (Signed)
 Lab orders have been placed in EPIC.   CT order placed in EPIC.

## 2024-04-18 NOTE — Telephone Encounter (Signed)
 Left message for patient. She has a follow up appointment with Ellouise on 05/23/24 to discuss multiple questions/concerns. She should keep this appointment. She is also advised that Ellouise sent a mychart message to her with some recommendations so it may also be beneficial for her to take a look at that if able.

## 2024-04-18 NOTE — Telephone Encounter (Signed)
 Patient sent letter to [facility] in MyChart regarding her concerns following her phone call to our office. Will await Tina's response.

## 2024-04-19 MED ORDER — NA SULFATE-K SULFATE-MG SULF 17.5-3.13-1.6 GM/177ML PO SOLN: ORAL | 0 refills | Status: DC

## 2024-04-19 NOTE — Telephone Encounter (Signed)
 I have spoken to patient to advise of lab orders placed and her need to come for these this week at [facility] lab.  I have discussed CT scheduled 04/26/24 at 5 pm with 430 pm arrival to [facility].  I have advised of appointment with Ellouise on 06/08/24 at 3 pm.  I have discussed endoscopy and colonoscopy scheduled on 05/25/24. Patient has been advised of time/date/location for upcoming procedure and has been given generalized verbal prep instructions. Discussed that a care partner 18 years or older should bring her, stay for the procedure and drive home due to sedation. Written instructions have been made available to the patient for additional review in mychart. She is asked to call with any questions or concerns.

## 2024-04-20 ENCOUNTER — Encounter: Payer: Self-pay | Admitting: *Deleted

## 2024-04-20 ENCOUNTER — Other Ambulatory Visit

## 2024-04-20 ENCOUNTER — Ambulatory Visit: Payer: Self-pay | Admitting: Physician Assistant

## 2024-04-20 DIAGNOSIS — R197 Diarrhea, unspecified: Secondary | ICD-10-CM

## 2024-04-20 DIAGNOSIS — Z860100 Personal history of colon polyps, unspecified: Secondary | ICD-10-CM | POA: Diagnosis not present

## 2024-04-20 DIAGNOSIS — R634 Abnormal weight loss: Secondary | ICD-10-CM

## 2024-04-20 LAB — COMPREHENSIVE METABOLIC PANEL WITH GFR
ALT: 10 U/L (ref 3–35)
AST: 16 U/L (ref 5–37)
Albumin: 4.1 g/dL (ref 3.5–5.2)
Alkaline Phosphatase: 51 U/L (ref 39–117)
BUN: 11 mg/dL (ref 6–23)
CO2: 30 meq/L (ref 19–32)
Calcium: 8.9 mg/dL (ref 8.4–10.5)
Chloride: 103 meq/L (ref 96–112)
Creatinine, Ser: 0.68 mg/dL (ref 0.40–1.20)
GFR: 91.55 mL/min (ref >60.00)
Glucose, Bld: 92 mg/dL (ref 70–99)
Potassium: 3.8 meq/L (ref 3.5–5.1)
Sodium: 139 meq/L (ref 135–145)
Total Bilirubin: 0.6 mg/dL (ref 0.2–1.2)
Total Protein: 6.4 g/dL (ref 6.0–8.3)

## 2024-04-20 LAB — CBC WITH DIFFERENTIAL/PLATELET
Basophils Absolute: 0 K/uL (ref 0.0–0.1)
Basophils Relative: 0.4 % (ref 0.0–3.0)
Eosinophils Absolute: 0.1 K/uL (ref 0.0–0.7)
Eosinophils Relative: 2.2 % (ref 0.0–5.0)
HCT: 39.7 % (ref 36.0–46.0)
Hemoglobin: 13.5 g/dL (ref 12.0–15.0)
Lymphocytes Relative: 30.8 % (ref 12.0–46.0)
Lymphs Abs: 1.2 K/uL (ref 0.7–4.0)
MCHC: 34 g/dL (ref 30.0–36.0)
MCV: 92 fl (ref 78.0–100.0)
Monocytes Absolute: 0.2 K/uL (ref 0.1–1.0)
Monocytes Relative: 6.2 % (ref 3.0–12.0)
Neutro Abs: 2.4 K/uL (ref 1.4–7.7)
Neutrophils Relative %: 60.4 % (ref 43.0–77.0)
Platelets: 268 K/uL (ref 150.0–400.0)
RBC: 4.31 Mil/uL (ref 3.87–5.11)
RDW: 13.1 % (ref 11.5–15.5)
WBC: 4 K/uL (ref 4.0–10.5)

## 2024-04-20 LAB — TSH: TSH: 1.54 u[IU]/mL (ref 0.35–5.50)

## 2024-04-21 ENCOUNTER — Ambulatory Visit: Admitting: Gastroenterology

## 2024-04-26 ENCOUNTER — Ambulatory Visit (HOSPITAL_COMMUNITY)
Admission: RE | Admit: 2024-04-26 | Discharge: 2024-04-26 | Disposition: A | Discharge status: Home / Self Care | Source: Ambulatory Visit | Attending: Physician Assistant | Admitting: Physician Assistant

## 2024-04-26 DIAGNOSIS — R634 Abnormal weight loss: Secondary | ICD-10-CM | POA: Insufficient documentation

## 2024-04-26 DIAGNOSIS — R197 Diarrhea, unspecified: Secondary | ICD-10-CM | POA: Diagnosis present

## 2024-04-26 DIAGNOSIS — Z860100 Personal history of colon polyps, unspecified: Secondary | ICD-10-CM | POA: Diagnosis present

## 2024-04-26 MED ORDER — IOHEXOL 300 MG/ML  SOLN
100.0000 mL | Freq: Once | INTRAMUSCULAR | Status: AC | PRN
  Administered 2024-04-26: 100 mL via INTRAVENOUS

## 2024-05-02 ENCOUNTER — Telehealth: Payer: Self-pay | Admitting: Physician Assistant

## 2024-05-02 NOTE — Telephone Encounter (Signed)
 The labs have been faxed to pcp as requested.

## 2024-05-02 NOTE — Telephone Encounter (Signed)
 Inbound call from patient stating that she would like us  to fax over her recent labs over to her PCP Dr. Oneil Neth. Good fax number for there office is [phone]. Please advise.

## 2024-05-18 ENCOUNTER — Ambulatory Visit: Admitting: Physician Assistant

## 2024-05-25 ENCOUNTER — Encounter: Payer: Self-pay | Admitting: Gastroenterology

## 2024-05-25 ENCOUNTER — Ambulatory Visit: Admitting: Physician Assistant

## 2024-05-25 ENCOUNTER — Ambulatory Visit: Admitting: Gastroenterology

## 2024-05-25 VITALS — BP 128/74 | HR 73 | Temp 98.1°F | Resp 13 | Ht 70.0 in | Wt 158.0 lb

## 2024-05-25 DIAGNOSIS — D122 Benign neoplasm of ascending colon: Secondary | ICD-10-CM | POA: Diagnosis not present

## 2024-05-25 DIAGNOSIS — K529 Noninfective gastroenteritis and colitis, unspecified: Secondary | ICD-10-CM | POA: Diagnosis not present

## 2024-05-25 DIAGNOSIS — Z860100 Personal history of colon polyps, unspecified: Secondary | ICD-10-CM

## 2024-05-25 DIAGNOSIS — R197 Diarrhea, unspecified: Secondary | ICD-10-CM

## 2024-05-25 DIAGNOSIS — K317 Polyp of stomach and duodenum: Secondary | ICD-10-CM | POA: Diagnosis not present

## 2024-05-25 DIAGNOSIS — K2950 Unspecified chronic gastritis without bleeding: Secondary | ICD-10-CM

## 2024-05-25 DIAGNOSIS — D120 Benign neoplasm of cecum: Secondary | ICD-10-CM

## 2024-05-25 DIAGNOSIS — R634 Abnormal weight loss: Secondary | ICD-10-CM

## 2024-05-25 DIAGNOSIS — K449 Diaphragmatic hernia without obstruction or gangrene: Secondary | ICD-10-CM

## 2024-05-25 DIAGNOSIS — R1310 Dysphagia, unspecified: Secondary | ICD-10-CM

## 2024-05-25 MED ORDER — SODIUM CHLORIDE 0.9 % IV SOLN: 500.0000 mL | Freq: Once | INTRAVENOUS | Status: DC

## 2024-05-25 NOTE — Patient Instructions (Signed)
 Discharge instructions given. Handouts on polyps,Diverticulosis and Hemorrhoids. Resume previous medications. YOU HAD AN ENDOSCOPIC PROCEDURE TODAY AT THE [facility]:   Refer to the procedure report that was given to you for any specific questions about what was found during the examination.  If the procedure report does not answer your questions, please call your gastroenterologist to clarify.  If you requested that your care partner not be given the details of your procedure findings, then the procedure report has been included in a sealed envelope for you to review at your convenience later.  YOU SHOULD EXPECT: Some feelings of bloating in the abdomen. Passage of more gas than usual.  Walking can help get rid of the air that was put into your GI tract during the procedure and reduce the bloating. If you had a lower endoscopy (such as a colonoscopy or flexible sigmoidoscopy) you may notice spotting of blood in your stool or on the toilet paper. If you underwent a bowel prep for your procedure, you may not have a normal bowel movement for a few days.  Please Note:  You might notice some irritation and congestion in your nose or some drainage.  This is from the oxygen used during your procedure.  There is no need for concern and it should clear up in a day or so.  SYMPTOMS TO REPORT IMMEDIATELY:  Following lower endoscopy (colonoscopy or flexible sigmoidoscopy):  Excessive amounts of blood in the stool  Significant tenderness or worsening of abdominal pains  Swelling of the abdomen that is new, acute  Fever of 100F or higher  Following upper endoscopy (EGD)  Vomiting of blood or coffee ground material  New chest pain or pain under the shoulder blades  Painful or persistently difficult swallowing  New shortness of breath  Fever of 100F or higher  Black, tarry-looking stools  For urgent or emergent issues, a gastroenterologist can be reached at any hour by calling [phone]  [phone]. Do not use MyChart messaging for urgent concerns.    DIET:  We do recommend a small meal at first, but then you may proceed to your regular diet.  Drink plenty of fluids but you should avoid alcoholic beverages for 24 hours.  ACTIVITY:  You should plan to take it easy for the rest of today and you should NOT DRIVE or use heavy machinery until tomorrow (because of the sedation medicines used during the test).    FOLLOW UP: Our staff will call the number listed on your records the next business day following your procedure.  We will call around 7:15- 8:00 am to check on you and address any questions or concerns that you may have regarding the information given to you following your procedure. If we do not reach you, we will leave a message.     If any biopsies were taken you will be contacted by phone or by letter within the next 1-3 weeks.  Please call us  at [phone] if you have not heard about the biopsies in 3 weeks.    SIGNATURES/CONFIDENTIALITY: You and/or your care partner have signed paperwork which will be entered into your electronic medical record.  These signatures attest to the fact that that the information above on your After Visit Summary has been reviewed and is understood.  Full responsibility of the confidentiality of this discharge information lies with you and/or your care-partner.

## 2024-05-25 NOTE — Progress Notes (Signed)
 Called to room to assist during endoscopic procedure.  Patient ID and intended procedure confirmed with present staff. Received instructions for my participation in the procedure from the performing physician.

## 2024-05-25 NOTE — Op Note (Signed)
 [facility] Patient Name: Natalie Li Procedure Date: 05/25/2024 2:43 PM MRN: [id number] Endoscopist: Elspeth P. Leigh , MD, [id number] Age: 64 Referring MD:  Date of Birth: 06/18/1960 Gender: Female Account #: [id number] Procedure:                Upper GI endoscopy Indications:              Dysphagia (mild intermittent), Diarrhea, Weight                            loss - negative CT scan, labs Medicines:                Monitored Anesthesia Care Procedure:                Pre-Anesthesia Assessment:                           - Prior to the procedure, a History and Physical                            was performed, and patient medications and                            allergies were reviewed. The patient's tolerance of                            previous anesthesia was also reviewed. The risks                            and benefits of the procedure and the sedation                            options and risks were discussed with the patient.                            All questions were answered, and informed consent                            was obtained. Prior Anticoagulants: The patient has                            taken no anticoagulant or antiplatelet agents. ASA                            Grade Assessment: II - A patient with mild systemic                            disease. After reviewing the risks and benefits,                            the patient was deemed in satisfactory condition to                            undergo the procedure.  After obtaining informed consent, the endoscope was                            passed under direct vision. Throughout the                            procedure, the patient's blood pressure, pulse, and                            oxygen saturations were monitored continuously. The                            GIF [id number] #[id number] was introduced through the                            mouth, and advanced to  the second part of duodenum.                            The upper GI endoscopy was accomplished without                            difficulty. The patient tolerated the procedure                            well. Scope In: Scope Out: Findings:                 Esophagogastric landmarks were identified: the                            Z-line was found at 38 cm, the gastroesophageal                            junction was found at 38 cm and the upper extent of                            the gastric folds was found at 40 cm from the                            incisors.                           A 2 cm hiatal hernia was present.                           Areas of ectopic gastric mucosa were found in the                            upper third of the esophagus.                           The exam of the esophagus was otherwise normal. No                            focal  stenosis / stricture appreciated. Symptoms                            mild / intermittent, patient declined empiric                            dilation preprocedure.                           A single 3 mm sessile polyp was found in the                            gastric body. The polyp was removed with a cold                            biopsy forceps. Resection and retrieval were                            complete.                           The exam of the stomach was otherwise normal.                           Biopsies were taken with a cold forceps for                            Helicobacter pylori testing.                           The examined duodenum was normal. Biopsies for                            histology were taken with a cold forceps for                            evaluation of celiac disease. Complications:            No immediate complications. Estimated blood loss:                            Minimal. Estimated Blood Loss:     Estimated blood loss was minimal. Impression:               - Esophagogastric  landmarks identified.                           - 2 cm hiatal hernia.                           - Ectopic gastric mucosa in the upper third of the                            esophagus.                           - Normal esophagus otherwise -  no focal stenosis /                            stricture appreciated.                           - A single gastric polyp. Resected and retrieved.                           - Normal stomach otherwise - biopsies taken to rule                            out H pylori.                           - Normal examined duodenum. Biopsied.                           No overt cause for symptoms on this exam, will                            await biopsy results. Recommendation:           - Patient has a contact number available for                            emergencies. The signs and symptoms of potential                            delayed complications were discussed with the                            patient. Return to normal activities tomorrow.                            Written discharge instructions were provided to the                            patient.                           - Resume previous diet.                           - Continue present medications.                           - Await pathology results. Elspeth P. Kianah Harries, MD 05/25/2024 3:03:53 PM This report has been signed electronically.

## 2024-05-25 NOTE — Progress Notes (Signed)
 Sedate, gd SR, tolerated procedure well, VSS, report to RN

## 2024-05-25 NOTE — Op Note (Signed)
 [facility] Patient Name: Natalie Li Procedure Date: 05/25/2024 2:43 PM MRN: [id number] Endoscopist: Elspeth P. Leigh , MD, [id number] Age: 64 Referring MD:  Date of Birth: May 10, 1960 Gender: Female Account #: [id number] Procedure:                Colonoscopy Indications:              Chronic diarrhea, Weight loss Medicines:                Monitored Anesthesia Care Procedure:                Pre-Anesthesia Assessment:                           - Prior to the procedure, a History and Physical                            was performed, and patient medications and                            allergies were reviewed. The patient's tolerance of                            previous anesthesia was also reviewed. The risks                            and benefits of the procedure and the sedation                            options and risks were discussed with the patient.                            All questions were answered, and informed consent                            was obtained. Prior Anticoagulants: The patient has                            taken no anticoagulant or antiplatelet agents. ASA                            Grade Assessment: II - A patient with mild systemic                            disease. After reviewing the risks and benefits,                            the patient was deemed in satisfactory condition to                            undergo the procedure.                           After obtaining informed consent, the colonoscope  was passed under direct vision. Throughout the                            procedure, the patient's blood pressure, pulse, and                            oxygen saturations were monitored continuously. The                            PCF-H190TL Slim SN [id number] was introduced through                            the anus and advanced to the the terminal ileum,                            with identification of  the appendiceal orifice and                            IC valve. The colonoscopy was performed without                            difficulty. The patient tolerated the procedure                            well. The quality of the bowel preparation was                            adequate. The terminal ileum, ileocecal valve,                            appendiceal orifice, and rectum were photographed. Scope In: 3:04:56 PM Scope Out: 3:24:25 PM Scope Withdrawal Time: 0 hours 14 minutes 14 seconds  Total Procedure Duration: 0 hours 19 minutes 29 seconds  Findings:                 The perianal and digital rectal examinations were                            normal.                           The terminal ileum appeared normal.                           A 3 mm polyp was found in the cecum. The polyp was                            sessile. The polyp was removed with a cold snare.                            Resection and retrieval were complete.                           A 3 mm polyp was found in the ascending colon.  The                            polyp was sessile. The polyp was removed with a                            cold snare. Resection and retrieval were complete.                           The colon was extremely tortuous. Ultraslim                            pediatric colonoscope used for this exam which                            worked well.                           Multiple diverticula were found in the transverse                            colon and left colon.                           Internal hemorrhoids were found during                            retroflexion. The hemorrhoids were small.                           The exam was otherwise without abnormality.                            Residual stool was present in the colon which led                            to lavage to obtain adequate views.                           Biopsies for histology were taken with a cold                             forceps from the right colon, left colon and                            transverse colon for evaluation of microscopic                            colitis. Complications:            No immediate complications. Estimated blood loss:                            Minimal. Estimated Blood Loss:     Estimated blood loss was minimal. Impression:               - The  examined portion of the ileum was normal.                           - One 3 mm polyp in the cecum, removed with a cold                            snare. Resected and retrieved.                           - One 3 mm polyp in the ascending colon, removed                            with a cold snare. Resected and retrieved.                           - Tortuous colon.                           - Diverticulosis in the transverse colon and in the                            left colon.                           - Internal hemorrhoids.                           - The examination was otherwise normal.                           - Biopsies were taken with a cold forceps from the                            right colon, left colon and transverse colon for                            evaluation of microscopic colitis. Recommendation:           - Patient has a contact number available for                            emergencies. The signs and symptoms of potential                            delayed complications were discussed with the                            patient. Return to normal activities tomorrow.                            Written discharge instructions were provided to the                            patient.                           -  Resume previous diet.                           - Continue present medications.                           - Await pathology results with further                            recommendations. Elspeth P. Yoseline Andersson, MD 05/25/2024 3:31:44 PM This report has been signed electronically.

## 2024-05-25 NOTE — Progress Notes (Signed)
 [facility] History and Physical   Primary Care Physician:  Shayne Anes, MD   Reason for Procedure:   History of colon polyps - change in bowel habits / loose stools, weight loss. dysphagia  Plan:    EGD and colonoscopy     HPI: Natalie Li is a 64 y.o. female  here for EGD and colonoscopy - she has had symptoms as outlined above which have been worked up with CT scan and labs that have been unremarkable. NEgative fecal calprotectin and GI pathogen panel. EGD and colonoscopy to further evaluate. Her last exam was done 10/2019, tortous colon, small SSPs removed. Dysphagia is mild / intermittent and not her main complaint. I asked her if she wanted empiric dilation if the exam does not show a clear source for that and she declines empiric dilation.    Otherwise feels well without any cardiopulmonary symptoms.   I have discussed risks / benefits of anesthesia and endoscopic procedure with [name] and they wish to proceed with the exams as outlined today.   The patient was provided an opportunity to ask questions and all were answered. The patient agreed with the plan.    Past Medical History:  Diagnosis Date   Anxiety    Hx of type A viral hepatitis    in [place] at age 67   Hyperlipidemia    Hypertension     Past Surgical History:  Procedure Laterality Date   arm Right    as a child    COLONOSCOPY  2021   age 2. Done in [place] [facility]; 2021    Prior to Admission medications  Medication Sig Start Date End Date Taking? Authorizing Provider  buPROPion (WELLBUTRIN XL) 300 MG 24 hr tablet Take 300 mg by mouth daily.   Yes [provider]  Cholecalciferol (VITAMIN D3) 50 MCG (2000 UT) TABS Take 1 tablet by mouth daily.    Yes [provider]  Psyllium (METAMUCIL PO) Metamucil   Yes [provider]  vitamin B-12 (CYANOCOBALAMIN ) 1000 MCG tablet Take 1,000 mcg by mouth daily.   Yes [provider]   ALPRAZolam (XANAX) 0.5 MG tablet Take 1-2 mg by mouth at bedtime as needed.  Patient not taking: Reported on 05/25/2024    [provider]  Amphetamine-Dextroamphetamine (ADDERALL PO) Take 2.5 mg by mouth daily as needed. Takes only a quarter of a 10 mg tablet a couple times per year. Patient not taking: Reported on 05/25/2024    [provider]  Calcium Carbonate Antacid (TUMS PO) Take 1 tablet by mouth as needed. Patient not taking: Reported on 05/25/2024    [provider]  escitalopram (LEXAPRO) 10 MG tablet Take 10 mg by mouth daily. Patient not taking: Reported on 05/25/2024    [provider]  methylphenidate (RITALIN) 10 MG tablet 1/2 TO 1 tablet on empty stomach Orally Twice a day Patient not taking: Reported on 05/25/2024    [provider]  omeprazole (PRILOSEC) 20 MG capsule     [provider]  traZODone (DESYREL) 50 MG tablet Take 50 mg by mouth at bedtime as needed. Patient not taking: Reported on 05/25/2024    [provider]    Current Outpatient Medications  Medication Sig Dispense Refill   buPROPion (WELLBUTRIN XL) 300 MG 24 hr tablet Take 300 mg by mouth daily.     Cholecalciferol (VITAMIN D3) 50 MCG (2000 UT) TABS Take 1 tablet by mouth daily.  Psyllium (METAMUCIL PO) Metamucil     vitamin B-12 (CYANOCOBALAMIN ) 1000 MCG tablet Take 1,000 mcg by mouth daily.     ALPRAZolam (XANAX) 0.5 MG tablet Take 1-2 mg by mouth at bedtime as needed.  (Patient not taking: Reported on 05/25/2024)     Amphetamine-Dextroamphetamine (ADDERALL PO) Take 2.5 mg by mouth daily as needed. Takes only a quarter of a 10 mg tablet a couple times per year. (Patient not taking: Reported on 05/25/2024)     Calcium Carbonate Antacid (TUMS PO) Take 1 tablet by mouth as needed. (Patient not taking: Reported on 05/25/2024)     escitalopram (LEXAPRO) 10 MG tablet Take 10 mg by mouth daily. (Patient not taking: Reported on 05/25/2024)      methylphenidate (RITALIN) 10 MG tablet 1/2 TO 1 tablet on empty stomach Orally Twice a day (Patient not taking: Reported on 05/25/2024)     omeprazole (PRILOSEC) 20 MG capsule  (Patient not taking: Reported on 05/25/2024)     traZODone (DESYREL) 50 MG tablet Take 50 mg by mouth at bedtime as needed. (Patient not taking: Reported on 05/25/2024)     Current Facility-Administered Medications  Medication Dose Route Frequency Provider Last Rate Last Admin   0.9 %  sodium chloride  infusion  500 mL Intravenous Once Oris Staffieri, Elspeth SQUIBB, MD        Allergies as of 05/25/2024 - Review Complete 05/25/2024  Allergen Reaction Noted   Escitalopram Other (See Comments) 05/25/2024   Prednisone Rash 05/07/2017   Zoster vac recomb adjuvanted Other (See Comments) 11/16/2017    Family History  Problem Relation Age of Onset   Depression Mother        mild bipolar   Heart attack Father 64   Colon cancer Neg Hx    Esophageal cancer Neg Hx    Pancreatic cancer Neg Hx    Stomach cancer Neg Hx    Rectal cancer Neg Hx     Social History   Socioeconomic History   Marital status: Married    Spouse name: Not on file   Number of children: Not on file   Years of education: Not on file   Highest education level: Not on file  Occupational History   Not on file  Tobacco Use   Smoking status: Never   Smokeless tobacco: Never  Vaping Use   Vaping status: Never Used  Substance and Sexual Activity   Alcohol use: Yes    Comment: ~2 drinks per day   Drug use: Yes    Comment: THC and CBD gummies for sleep   Sexual activity: Not on file  Other Topics Concern   Not on file  Social History Narrative   Not on file   Social Drivers of Health   Tobacco Use: Low Risk (05/25/2024)   Patient History    Smoking Tobacco Use: Never    Smokeless Tobacco Use: Never    Passive Exposure: Not on file  Financial Resource Strain: Not on file  Food Insecurity: Low Risk (02/24/2024)   Received from [facility]   Epic     Within the past 12 months, you worried that your food would run out before you got money to buy more: Never true    Within the past 12 months, the food you bought just didn't last and you didn't have money to get more. : Never true  Transportation Needs: No Transportation Needs (02/24/2024)   Received from [facility]    In the past 12  months, has lack of reliable transportation kept you from medical appointments, meetings, work or from getting things needed for daily living? : No  Physical Activity: Not on file  Stress: Not on file  Social Connections: Not on file  Intimate Partner Violence: Not on file  Depression (EYV7-0): Not on file  Alcohol Screen: Not on file  Housing: Low Risk (02/24/2024)   Received from [facility]   Epic    What is your living situation today?: I have a steady place to live    Think about the place you live. Do you have problems with any of the following? Choose all that apply:: Not on file  Utilities: Low Risk (02/24/2024)   Received from [facility]   Utilities    In the past 12 months has the electric, gas, oil, or water company threatened to shut off services in your home? : No  Health Literacy: Not on file    Review of Systems: All other review of systems negative except as mentioned in the HPI.  Physical Exam: Vital signs BP 132/76   Pulse 79   Temp 98.1 F (36.7 C) (Temporal)   Ht 5' 10 (1.778 m)   Wt 158 lb (71.7 kg)   SpO2 100%   BMI 22.67 kg/m   General:   Alert,  Well-developed, pleasant and cooperative in NAD Lungs:  Clear throughout to auscultation.   Heart:  Regular rate and rhythm Abdomen:  Soft, nontender and nondistended.   Neuro/Psych:  Alert and cooperative. Normal mood and affect. A and O x 3  Marcey Naval, MD [facility]

## 2024-05-26 ENCOUNTER — Telehealth: Payer: Self-pay | Admitting: *Deleted

## 2024-05-26 NOTE — Telephone Encounter (Signed)
 No answer for follow up call. Left a message.

## 2024-05-30 ENCOUNTER — Ambulatory Visit: Payer: Self-pay | Admitting: Gastroenterology

## 2024-05-30 LAB — SURGICAL PATHOLOGY

## 2024-06-08 ENCOUNTER — Encounter: Payer: Self-pay | Admitting: Physician Assistant

## 2024-06-08 ENCOUNTER — Ambulatory Visit: Admitting: Physician Assistant

## 2024-06-08 VITALS — BP 118/80 | HR 62 | Ht 70.0 in | Wt 154.0 lb

## 2024-06-08 DIAGNOSIS — Z860101 Personal history of adenomatous and serrated colon polyps: Secondary | ICD-10-CM | POA: Diagnosis not present

## 2024-06-08 DIAGNOSIS — E739 Lactose intolerance, unspecified: Secondary | ICD-10-CM

## 2024-06-08 DIAGNOSIS — K580 Irritable bowel syndrome with diarrhea: Secondary | ICD-10-CM

## 2024-06-08 DIAGNOSIS — Z860100 Personal history of colon polyps, unspecified: Secondary | ICD-10-CM

## 2024-06-08 MED ORDER — RESTORA PO CAPS: 1.0000 | ORAL_CAPSULE | Freq: Every day | ORAL | Status: AC

## 2024-06-08 NOTE — Patient Instructions (Addendum)
 _______________________________________________________  If your blood pressure at your visit was 140/90 or greater, please contact your primary care physician to follow up on this.  _______________________________________________________  If you are age 66 or older, your body mass index should be between 23-30. Your Body mass index is 22.1 kg/m. If this is out of the aforementioned range listed, please consider follow up with your Primary Care Provider.  If you are age 46 or younger, your body mass index should be between 19-25. Your Body mass index is 22.1 kg/m. If this is out of the aformentioned range listed, please consider follow up with your Primary Care Provider.   ________________________________________________________  The [facility] providers would like to encourage you to use MYCHART to communicate with providers for non-urgent requests or questions.  Due to long hold times on the telephone, sending your provider a message by [facility] may be a faster and more efficient way to get a response.  Please allow 48 business hours for a response.  Please remember that this is for non-urgent requests.  _______________________________________________________  [facility] is using a team-based approach to care.  Your team is made up of your doctor and two to three APPS. Our APPS (Nurse Practitioners and Physician Assistants) work with your physician to ensure care continuity for you. They are fully qualified to address your health concerns and develop a treatment plan. They communicate directly with your gastroenterologist to care for you. Seeing the Advanced Practice Practitioners on your physician's team can help you by facilitating care more promptly, often allowing for earlier appointments, access to diagnostic testing, procedures, and other specialty referrals.   Please start Samples of Restora Probiotic 1 capsule once daily - Samples given for 10 days. If Restora helps your GI  symptoms, then you can purchase more over the counter.  I also recommend trying the low FODMAP diet to help you feel better.  It was great to see you today.  Please let us  know if GI symptoms worsen.  Ellouise Console, PA-C

## 2024-06-08 NOTE — Progress Notes (Signed)
 Agree with assessment and plan as outlined.

## 2024-06-10 ENCOUNTER — Telehealth: Payer: Self-pay | Admitting: Gastroenterology

## 2024-06-10 NOTE — Telephone Encounter (Signed)
 Inbound call from patient stating she would like to speak to nurse in regards to recent letter she received with results from endo/colon. Requesting a call back  Please advise  Thank you

## 2024-06-10 NOTE — Telephone Encounter (Signed)
 Left message for pt to call back

## 2024-06-10 NOTE — Telephone Encounter (Signed)
 Spoke with patient. Patient inquiring about the letter she received after her colon/EGD. Patient states she was given the FODMAP diet but after researching she read this is a short term diet. Patient states her letter said she has a hiatal hernia & diverticula that she wasn't aware of. Reviewed colon/EGD procedures/letter with patient. Informed no inflammation was seen with her colon but instructed on foods to try to avoid with diverticulosis. Patient inquiring more of a treatment plan & questioned wether she should see a dietician to try & get a better treatment plan for these conditions.
